# Patient Record
Sex: Male | Born: 1960 | Race: White | Hispanic: No | Marital: Married | Smoking: Never smoker
Health system: Southern US, Community
[De-identification: ages and names within clinical notes are randomized; demographics above are authoritative.]

## PROBLEM LIST (undated history)

## (undated) DIAGNOSIS — R011 Cardiac murmur, unspecified: Secondary | ICD-10-CM

## (undated) DIAGNOSIS — D509 Iron deficiency anemia, unspecified: Secondary | ICD-10-CM

## (undated) DIAGNOSIS — E039 Hypothyroidism, unspecified: Secondary | ICD-10-CM

## (undated) DIAGNOSIS — I10 Essential (primary) hypertension: Secondary | ICD-10-CM

## (undated) DIAGNOSIS — I639 Cerebral infarction, unspecified: Secondary | ICD-10-CM

## (undated) DIAGNOSIS — J302 Other seasonal allergic rhinitis: Secondary | ICD-10-CM

## (undated) DIAGNOSIS — K219 Gastro-esophageal reflux disease without esophagitis: Secondary | ICD-10-CM

## (undated) DIAGNOSIS — Z87442 Personal history of urinary calculi: Secondary | ICD-10-CM

## (undated) HISTORY — PX: ESOPHAGOGASTRODUODENOSCOPY ENDOSCOPY: SHX5814

## (undated) HISTORY — PX: COLONOSCOPY: SHX174

## (undated) HISTORY — PX: TONSILLECTOMY: SUR1361

## (undated) HISTORY — PX: VASECTOMY: SHX75

---

## 2002-11-08 ENCOUNTER — Ambulatory Visit (HOSPITAL_COMMUNITY): Admission: RE | Admit: 2002-11-08 | Discharge: 2002-11-08 | Payer: Self-pay | Admitting: Family Medicine

## 2008-08-17 ENCOUNTER — Emergency Department (HOSPITAL_BASED_OUTPATIENT_CLINIC_OR_DEPARTMENT_OTHER): Admission: EM | Admit: 2008-08-17 | Discharge: 2008-08-17 | Payer: Self-pay | Admitting: Emergency Medicine

## 2012-08-22 ENCOUNTER — Other Ambulatory Visit: Payer: Self-pay

## 2012-08-22 ENCOUNTER — Encounter (HOSPITAL_COMMUNITY): Payer: Self-pay | Admitting: Emergency Medicine

## 2012-08-22 ENCOUNTER — Emergency Department (HOSPITAL_COMMUNITY)
Admission: EM | Admit: 2012-08-22 | Discharge: 2012-08-22 | Disposition: A | Discharge status: Home / Self Care | Payer: 59 | Attending: Emergency Medicine | Admitting: Emergency Medicine

## 2012-08-22 DIAGNOSIS — Y929 Unspecified place or not applicable: Secondary | ICD-10-CM | POA: Insufficient documentation

## 2012-08-22 DIAGNOSIS — E039 Hypothyroidism, unspecified: Secondary | ICD-10-CM | POA: Insufficient documentation

## 2012-08-22 DIAGNOSIS — S199XXA Unspecified injury of neck, initial encounter: Secondary | ICD-10-CM | POA: Insufficient documentation

## 2012-08-22 DIAGNOSIS — R110 Nausea: Secondary | ICD-10-CM | POA: Insufficient documentation

## 2012-08-22 DIAGNOSIS — IMO0002 Reserved for concepts with insufficient information to code with codable children: Secondary | ICD-10-CM | POA: Insufficient documentation

## 2012-08-22 DIAGNOSIS — R142 Eructation: Secondary | ICD-10-CM | POA: Insufficient documentation

## 2012-08-22 DIAGNOSIS — I10 Essential (primary) hypertension: Secondary | ICD-10-CM | POA: Insufficient documentation

## 2012-08-22 DIAGNOSIS — R5383 Other fatigue: Secondary | ICD-10-CM | POA: Insufficient documentation

## 2012-08-22 DIAGNOSIS — Y939 Activity, unspecified: Secondary | ICD-10-CM | POA: Insufficient documentation

## 2012-08-22 DIAGNOSIS — R5381 Other malaise: Secondary | ICD-10-CM | POA: Insufficient documentation

## 2012-08-22 DIAGNOSIS — R141 Gas pain: Secondary | ICD-10-CM | POA: Insufficient documentation

## 2012-08-22 DIAGNOSIS — Z79899 Other long term (current) drug therapy: Secondary | ICD-10-CM | POA: Insufficient documentation

## 2012-08-22 DIAGNOSIS — S0993XA Unspecified injury of face, initial encounter: Secondary | ICD-10-CM | POA: Insufficient documentation

## 2012-08-22 HISTORY — DX: Hypothyroidism, unspecified: E03.9

## 2012-08-22 HISTORY — DX: Essential (primary) hypertension: I10

## 2012-08-22 LAB — POCT I-STAT TROPONIN I: Troponin i, poc: 0 ng/mL (ref 0.00–0.08)

## 2012-08-22 LAB — POCT I-STAT, CHEM 8
BUN: 13 mg/dL (ref 6–23)
Calcium, Ion: 1.17 mmol/L (ref 1.12–1.23)
Chloride: 101 mEq/L (ref 96–112)
Creatinine, Ser: 1 mg/dL (ref 0.50–1.35)
Glucose, Bld: 123 mg/dL — ABNORMAL HIGH (ref 70–99)
HCT: 40 % (ref 39.0–52.0)
Hemoglobin: 13.6 g/dL (ref 13.0–17.0)
Potassium: 4 mEq/L (ref 3.5–5.1)
Sodium: 138 mEq/L (ref 135–145)
TCO2: 26 mmol/L (ref 0–100)

## 2012-08-22 MED ORDER — ONDANSETRON HCL 8 MG PO TABS: 8.0000 mg | ORAL_TABLET | Freq: Three times a day (TID) | ORAL | Status: DC | PRN

## 2012-08-22 NOTE — ED Provider Notes (Signed)
History     CSN: [id number]  Arrival date & time 08/22/12  4098   First MD Initiated Contact with Patient 08/22/12 2003      Chief Complaint  Patient presents with  . Anxiety    (Consider location/radiation/quality/duration/timing/severity/associated sxs/prior treatment) HPI History provided by pt.  Pt developed nausea, "icky stomach" and generalized weakness after lunch this afternoon.  Around the time he was leaving work, he developed some pain in his chin.  Because his father had an MI in his early 68s, pt thought it would be best to be evaluated.  Felt a gas bubble/gurgling in epigastrium and lower chest this afternoon but denies CP and SOB.  No recent exertional CP/SOB.  No recent illnesses including fever, cough, vomiting, diarrhea, urinary sx.  No recent medication changes.  Has had two mild head injuries in the last 2 days.  Hit L forehead on plastic tray two days ago and R side of forehead on car door last night.  No LOC.  No headache, dizziness, vision changes, dysarthria, dysphagia, paresthesias or ataxia since injuries.   Past Medical History  Diagnosis Date  . Hypertension   . Hypothyroidism     Past Surgical History  Procedure Laterality Date  . Tonsillectomy      History reviewed. No pertinent family history.  History  Substance Use Topics  . Smoking status: Never Smoker   . Smokeless tobacco: Never Used  . Alcohol Use: No      Review of Systems  All other systems reviewed and are negative.    Allergies  Doxycycline  Home Medications   Current Outpatient Rx  Name  Route  Sig  Dispense  Refill  . amLODipine (NORVASC) 5 MG tablet   Oral   Take 5 mg by mouth daily.         . cetirizine (ZYRTEC) 10 MG tablet   Oral   Take 10 mg by mouth daily.         Marland Kitchen levothyroxine (SYNTHROID, LEVOTHROID) 75 MCG tablet   Oral   Take 75-112.5 mcg by mouth daily before breakfast. Extra half on wed and sun           BP 148/91  Pulse 98  Temp(Src) 97.4  F (36.3 C) (Oral)  Resp 19  SpO2 100%  Physical Exam  Nursing note and vitals reviewed. Constitutional: He is oriented to person, place, and time. He appears well-developed and well-nourished. No distress.  HENT:  Head: Normocephalic and atraumatic.  No scalp hematoma  Eyes:  Normal appearance  Neck: Normal range of motion.  Cardiovascular: Normal rate, regular rhythm and intact distal pulses.   Pulmonary/Chest: Effort normal and breath sounds normal.  Abdominal: Soft. Bowel sounds are normal. He exhibits no distension. There is no tenderness.  Musculoskeletal: Normal range of motion.  No peripheral edema.   Neurological: He is alert and oriented to person, place, and time. No sensory deficit. Coordination normal.  CN 3-12 intact.  No nystagmus. 5/5 and equal upper and lower extremity strength.  No past pointing.     Skin: Skin is warm and dry. No rash noted.  Psychiatric: He has a normal mood and affect. His behavior is normal.    ED Course  Procedures (including critical care time)   Date: 08/22/2012  Rate: 83  Rhythm: normal sinus rhythm  QRS Axis: normal  Intervals: normal  ST/T Wave abnormalities: normal  Conduction Disutrbances:incomplete RBBB  Narrative Interpretation:   Old EKG Reviewed: none available  Labs Reviewed  POCT I-STAT, CHEM 8 - Abnormal; Notable for the following:    Glucose, Bld 123 (*)    All other components within normal limits  POCT I-STAT TROPONIN I   No results found.   1. Nausea       MDM  52yo M w/ HTN and hypothyroidism presents w/ nausea, "icky stomach" and generalized weakness today.  Felt a gas bubble in chest at rest this afternoon and had pain in his chin at the time he was leaving work.  He is concerned d/t FH of MI.  No recent exertional CP/SOB.  Currently asx.  2 minor frontal head traumas over past two days.  No LOC, headache or other neurologic complaints.  On exam, afebrile, mildly hypertensive, NAD, abd benign, no focal  neuro deficits.  Low suspicion for ACS;  Symptoms atypical, TIMI 0, EKG non-ischemic and troponin neg.  Chem 8 unremarkable.  Pt has been reassured and I recommended f/u with his PCP.  Sx may be secondary to mild concussion or early GI virus.  Return precautions discussed.    Otilio Miu, PA-C 08/22/12 2151

## 2012-08-22 NOTE — ED Notes (Signed)
Patient from work via [employer] c/o palpitations and nausea that started at about 1300 today.  Patient started feeling "jittery" and decided to call EMS.  Patient has hx of htn and anxiety. Patient has a family cardiac hx and wanted to be checked out. Patient on cardiac monitor HR 87 NSR.   Patient alert and oriented x4 and showing no signs of distress at this time.  Will continue to monitor.

## 2012-08-23 NOTE — ED Provider Notes (Signed)
  Medical screening examination/treatment/procedure(s) were performed by non-physician practitioner and as supervising physician I was immediately available for consultation/collaboration.   Gerhard Munch, MD 08/23/12 [phone]

## 2014-09-11 ENCOUNTER — Ambulatory Visit (INDEPENDENT_AMBULATORY_CARE_PROVIDER_SITE_OTHER): Payer: 59 | Admitting: Family Medicine

## 2014-09-11 VITALS — BP 120/86 | HR 92 | Temp 98.1°F | Resp 20 | Ht 69.0 in | Wt 175.2 lb

## 2014-09-11 DIAGNOSIS — J209 Acute bronchitis, unspecified: Secondary | ICD-10-CM | POA: Diagnosis not present

## 2014-09-11 MED ORDER — MONTELUKAST SODIUM 10 MG PO TABS: 10.0000 mg | ORAL_TABLET | Freq: Every day | ORAL | Status: DC

## 2014-09-11 MED ORDER — AZITHROMYCIN 250 MG PO TABS: ORAL_TABLET | ORAL | Status: DC

## 2014-09-11 NOTE — Progress Notes (Signed)
Patient ID: Richard Conrad MRN: [id number], DOB: 27-May-1960, 54 y.o. Date of Encounter: 09/11/2014, 10:48 AM  Primary Physician: No primary care provider on file.  Chief Complaint:  Chief Complaint  Patient presents with  . Cough    cough that comes and goes x 3 months.  lung pressure is worse with laying down    HPI: 54 y.o. year old male presents with a 90 day history of nasal congestion, post nasal drip, sore throat, and cough. Mild sinus pressure. Afebrile. No chills. Nasal congestion thick and green/yellow. Cough is productive of green/yellow sputum and not associated with time of day. Ears feel full, leading to sensation of muffled hearing. Has tried OTC cold preps without success. No GI complaints.   No sick contacts, recent antibiotics, or recent travels.   Patient works as a Cytogeneticist.  No leg trauma, sedentary periods, h/o cancer, or tobacco use.  Past Medical History  Diagnosis Date  . Hypertension   . Hypothyroidism   . Allergy      Home Meds: Prior to Admission medications   Medication Sig Start Date End Date Taking? Authorizing Provider  amLODipine (NORVASC) 5 MG tablet Take 5 mg by mouth daily.   Yes Historical Provider, MD  cetirizine (ZYRTEC) 10 MG tablet Take 10 mg by mouth daily.   Yes Historical Provider, MD  levothyroxine (SYNTHROID, LEVOTHROID) 75 MCG tablet Take 75-112.5 mcg by mouth daily before breakfast. Extra half on wed and sun   Yes Historical Provider, MD  azithromycin (ZITHROMAX Z-PAK) 250 MG tablet 2 today, then one daily 09/11/14   Robyn Haber, MD  montelukast (SINGULAIR) 10 MG tablet Take 1 tablet (10 mg total) by mouth at bedtime. 09/11/14   Robyn Haber, MD  ondansetron (ZOFRAN) 8 MG tablet Take 1 tablet (8 mg total) by mouth every 8 (eight) hours as needed for nausea. Patient not taking: Reported on 09/11/2014 08/22/12   Gertha Calkin, PA-C    Allergies:  Allergies  Allergen Reactions  . Ciprofloxacin     Increase  in anxiety  . Doxycycline Itching    History   Social History  . Marital Status: Married    Spouse Name: N/A  . Number of Children: N/A  . Years of Education: N/A   Occupational History  . Not on file.   Social History Main Topics  . Smoking status: Never Smoker   . Smokeless tobacco: Never Used  . Alcohol Use: No  . Drug Use: No  . Sexual Activity: Not on file   Other Topics Concern  . Not on file   Social History Narrative     Review of Systems: Constitutional: negative for chills, fever, night sweats or weight changes Cardiovascular: negative for chest pain or palpitations Respiratory: negative for hemoptysis, wheezing, or shortness of breath Abdominal: negative for abdominal pain, nausea, vomiting or diarrhea Dermatological: negative for rash Neurologic: negative for headache   Physical Exam: Blood pressure 120/86, pulse 92, temperature 98.1 F (36.7 C), temperature source Oral, resp. rate 20, height 5\' 9"  (1.753 m), weight 175 lb 4 oz (79.493 kg), SpO2 97 %., Body mass index is 25.87 kg/(m^2). General: Well developed, well nourished, in no acute distress. Head: Normocephalic, atraumatic, eyes without discharge, sclera non-icteric, nares are congested. Bilateral auditory canals clear, TM's are without perforation, pearly grey with reflective cone of light bilaterally. No sinus TTP. Oral cavity moist, dentition normal. Posterior pharynx with post nasal drip and mild erythema. No peritonsillar abscess or tonsillar exudate. Neck:  Supple. No thyromegaly. Full ROM. No lymphadenopathy. Lungs: Coarse breath sounds bilaterally without wheezes, rales, or rhonchi. Breathing is unlabored.  Heart: RRR with S1 S2. No murmurs, rubs, or gallops appreciated. Msk:  Strength and tone normal for age. Extremities: No clubbing or cyanosis. No edema. Neuro: Alert and oriented X 3. Moves all extremities spontaneously. CNII-XII grossly in tact. Psych:  Responds to questions appropriately  with a normal affect.     ASSESSMENT AND PLAN:  54 y.o. year old male with bronchitis. This chart was scribed in my presence and reviewed by me personally.    ICD-9-CM ICD-10-CM   1. Acute bronchitis, unspecified organism 466.0 J20.9 azithromycin (ZITHROMAX Z-PAK) 250 MG tablet     montelukast (SINGULAIR) 10 MG tablet   please call if symptoms have not improved in the next 72 hours. In that case we would use a steroid to go with the Singulair. -   ICD-9-CM ICD-10-CM   1. Acute bronchitis, unspecified organism 466.0 J20.9 azithromycin (ZITHROMAX Z-PAK) 250 MG tablet     montelukast (SINGULAIR) 10 MG tablet   -Tylenol/Motrin prn -Rest/fluids -RTC precautions -RTC 3-5 days if no improvement  Signed, Robyn Haber, MD 09/11/2014 10:48 AM

## 2014-09-11 NOTE — Patient Instructions (Signed)

## 2014-09-27 ENCOUNTER — Ambulatory Visit
Admission: RE | Admit: 2014-09-27 | Discharge: 2014-09-27 | Disposition: A | Discharge status: Home / Self Care | Payer: 59 | Source: Ambulatory Visit | Attending: Family Medicine | Admitting: Family Medicine

## 2014-09-27 ENCOUNTER — Other Ambulatory Visit: Payer: Self-pay | Admitting: Family Medicine

## 2014-09-27 DIAGNOSIS — J209 Acute bronchitis, unspecified: Secondary | ICD-10-CM

## 2016-01-13 ENCOUNTER — Other Ambulatory Visit: Payer: Self-pay | Admitting: Surgery

## 2016-03-15 NOTE — Pre-Procedure Instructions (Signed)
    DEKODA FRANSSEN  03/15/2016      [facility] to [address] [place] [address] Phone: [phone] Fax: [place], [place], [place] - [address] [place] [address] [place] [address] Phone: [phone] Fax: [phone]    Your procedure is scheduled on Tuesday, November 21.  Report to [facility] Admitting at 7:00 AM               Your surgery or procedure is scheduled for 9:00 AM   Call this number if you have problems the morning of surgery:[phone]                For any other questions, please call [phone], Monday - Friday 8 AM - 4 PM.   Remember:  Do not eat food or drink liquids after midnight Monday, November 20              Take these medicines the morning of surgery with A SIP OF WATER: amLODipine (NORVASC), levothyroxine (SYNTHROID, LEVOTHROID).                  May take cetirizine (ZYRTEC) if needed.             1 Week prior to surgery STOP taking Aspirin, Aspirin Products (Goody Powder, Excedrin Migraine), Ibuprofen (Advil), Naproxen (Aleve), Vitamins and Herbal Products (ie Fish Oil)   Do not wear jewelry, make-up or nail polish.  Do not wear lotions, powders, or perfumes, or deodorant.   Men may shave face and neck.  Do not bring valuables to the hospital.  [facility] is not responsible for any belongings or valuables.  Contacts, dentures or bridgework may not be worn into surgery.  Leave your suitcase in the car.  After surgery it may be brought to your room.  For patients admitted to the hospital, discharge time will be determined by your treatment team.  Patients discharged the day of surgery will not be allowed to drive home.   Name and phone number of your driver:   -  Special instructions: Review  [facility] - Preparing For Surgery.  Please read over the following fact sheets that you  were given: [facility]- Preparing For Surgery and Patient Instructions for Mupirocin Application, Coughing and Deep Breathing, Pain Booklet

## 2016-03-16 ENCOUNTER — Encounter (HOSPITAL_COMMUNITY)
Admission: RE | Admit: 2016-03-16 | Discharge: 2016-03-16 | Disposition: A | Discharge status: Home / Self Care | Payer: 59 | Source: Ambulatory Visit | Attending: Surgery | Admitting: Surgery

## 2016-03-16 ENCOUNTER — Encounter (HOSPITAL_COMMUNITY): Payer: Self-pay

## 2016-03-16 DIAGNOSIS — I10 Essential (primary) hypertension: Secondary | ICD-10-CM | POA: Insufficient documentation

## 2016-03-16 DIAGNOSIS — Z01810 Encounter for preprocedural cardiovascular examination: Secondary | ICD-10-CM | POA: Diagnosis present

## 2016-03-16 DIAGNOSIS — Z01812 Encounter for preprocedural laboratory examination: Secondary | ICD-10-CM | POA: Insufficient documentation

## 2016-03-16 LAB — BASIC METABOLIC PANEL
Anion gap: 6 (ref 5–15)
BUN: 10 mg/dL (ref 6–20)
CO2: 28 mmol/L (ref 22–32)
Calcium: 9.6 mg/dL (ref 8.9–10.3)
Chloride: 101 mmol/L (ref 101–111)
Creatinine, Ser: 1.01 mg/dL (ref 0.61–1.24)
GFR calc Af Amer: >60 mL/min (ref >60)
GFR calc non Af Amer: >60 mL/min (ref >60)
Glucose, Bld: 104 mg/dL — ABNORMAL HIGH (ref 65–99)
Potassium: 3.8 mmol/L (ref 3.5–5.1)
Sodium: 135 mmol/L (ref 135–145)

## 2016-03-16 LAB — CBC
HCT: 38.7 % — ABNORMAL LOW (ref 39.0–52.0)
Hemoglobin: 13.2 g/dL (ref 13.0–17.0)
MCH: 31.1 pg (ref 26.0–34.0)
MCHC: 34.1 g/dL (ref 30.0–36.0)
MCV: 91.1 fL (ref 78.0–100.0)
Platelets: 308 10*3/uL (ref 150–400)
RBC: 4.25 MIL/uL (ref 4.22–5.81)
RDW: 13.7 % (ref 11.5–15.5)
WBC: 7.5 10*3/uL (ref 4.0–10.5)

## 2016-03-22 NOTE — H&P (Signed)
Richard Conrad  Location: [facility] Patient #: [id number] DOB: 1960-09-10 Married / Language: English / Race: White Male   History of Present Illness The patient is a 55 year old male who presents with a colonic polyp. This is a very pleasant gentleman referred by Dr. Arta Silence for evaluation of a hyperplastic polyp of the cecum. This was found to be near the orifice of the appendix and was carpeted so could not be excised. He has had a history of polyps in the past. He is otherwise healthy without any complaints. He has no issues with his bowels.   Other Problems Anxiety Disorder Gastroesophageal Reflux Disease Heart murmur Hemorrhoids Thyroid Disease  Past Surgical History  Colon Polyp Removal - Colonoscopy Colon Polyp Removal - Open Tonsillectomy Vasectomy  Diagnostic Studies History  Colonoscopy within last year  Allergies Cipro *FLUOROQUINOLONES* Doxycycline Hyclate Coated *TETRACYCLINES*  Medication History Richard Conrad, CMA; AmLODIPine Besylate (5MG  Tablet, Oral) Active. Synthroid (75MCG Tablet, Oral) Active. Medications Reconciled  Social History Richard Conrad, [place];  Caffeine use Coffee, Tea. No alcohol use No drug use Tobacco use Never smoker.  Family History Richard Conrad, [place];  Heart Disease Father. Hypertension Father. Melanoma Mother. Prostate Cancer Father. Thyroid problems Mother.    Review of Systems Richard Conrad CMA;  General Not Present- Appetite Loss, Chills, Fatigue, Fever, Night Sweats, Weight Gain and Weight Loss. Skin Not Present- Change in Wart/Mole, Dryness, Hives, Jaundice, New Lesions, Non-Healing Wounds, Rash and Ulcer. HEENT Present- Seasonal Allergies and Wears glasses/contact lenses. Not Present- Earache, Hearing Loss, Hoarseness, Nose Bleed, Oral Ulcers, Ringing in the Ears, Sinus Pain, Sore Throat, Visual Disturbances and Yellow Eyes. Respiratory Not Present- Bloody sputum, Chronic  Cough, Difficulty Breathing, Snoring and Wheezing. Breast Not Present- Breast Mass, Breast Pain, Nipple Discharge and Skin Changes. Cardiovascular Not Present- Chest Pain, Difficulty Breathing Lying Down, Leg Cramps, Palpitations, Rapid Heart Rate, Shortness of Breath and Swelling of Extremities. Gastrointestinal Present- Hemorrhoids. Not Present- Abdominal Pain, Bloating, Bloody Stool, Change in Bowel Habits, Chronic diarrhea, Constipation, Difficulty Swallowing, Excessive gas, Gets full quickly at meals, Indigestion, Nausea, Rectal Pain and Vomiting. Male Genitourinary Present- Frequency. Not Present- Blood in Urine, Change in Urinary Stream, Impotence, Nocturia, Painful Urination, Urgency and Urine Leakage. Musculoskeletal Not Present- Back Pain, Joint Pain, Joint Stiffness, Muscle Pain, Muscle Weakness and Swelling of Extremities. Neurological Not Present- Decreased Memory, Fainting, Headaches, Numbness, Seizures, Tingling, Tremor, Trouble walking and Weakness. Psychiatric Not Present- Anxiety, Bipolar, Change in Sleep Pattern, Depression, Fearful and Frequent crying. Endocrine Present- Cold Intolerance. Not Present- Excessive Hunger, Hair Changes, Heat Intolerance, Hot flashes and New Diabetes. Hematology Not Present- Blood Thinners, Easy Bruising, Excessive bleeding, Gland problems, HIV and Persistent Infections.  Vitals   Weight: 178 lb Height: 68.5in Body Surface Area: 1.96 m Body Mass Index: 26.67 kg/m  Temp.: 98.7F(Temporal)  Pulse: 77 (Regular)  BP: 126/72 (Sitting, Left Arm, Standard)   Physical Exam  General Mental Status-Alert. General Appearance-Consistent with stated age. Hydration-Well hydrated. Voice-Normal.  Head and Neck Head-normocephalic, atraumatic with no lesions or palpable masses. Trachea-midline. Thyroid Gland Characteristics - normal size and consistency.  Eye Eyeball - Bilateral-Extraocular movements  intact. Sclera/Conjunctiva - Bilateral-No scleral icterus.  Chest and Lung Exam Chest and lung exam reveals -quiet, even and easy respiratory effort with no use of accessory muscles and on auscultation, normal breath sounds, no adventitious sounds and normal vocal resonance. Inspection Chest Wall - Normal. Back - normal.  Breast - Did not examine.  Cardiovascular Cardiovascular examination reveals -normal  heart sounds, regular rate and rhythm with no murmurs and normal pedal pulses bilaterally.  Abdomen Inspection Inspection of the abdomen reveals - No Hernias. Skin - Scar - no surgical scars. Palpation/Percussion Palpation and Percussion of the abdomen reveal - Soft, Non Tender, No Rebound tenderness, No Rigidity (guarding) and No hepatosplenomegaly. Auscultation Auscultation of the abdomen reveals - Bowel sounds normal.  Neurologic - Did not examine.  Musculoskeletal Normal Exam - Left-Upper Extremity Strength Normal and Lower Extremity Strength Normal. Normal Exam - Right-Upper Extremity Strength Normal and Lower Extremity Strength Normal.  Lymphatic - Did not examine.    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 Because of the location of this polyp, I can hopefully just perform a laparoscopic cecotomy without the need for a partial colectomy. I discussed the surgical procedure with him in detail. I discussed the risk of surgery which includes but is not limited to bleeding, infection, injury to surrounding structures, need for a formal partial colectomy depending on the location of the polyp, cardiopulmonary issues, DVT, postoperative recovery, etc. He understands and wished to proceed with surgery which will be scheduled

## 2016-03-23 ENCOUNTER — Observation Stay (HOSPITAL_COMMUNITY): Payer: 59

## 2016-03-23 ENCOUNTER — Encounter (HOSPITAL_COMMUNITY): Payer: Self-pay | Admitting: *Deleted

## 2016-03-23 ENCOUNTER — Observation Stay (HOSPITAL_COMMUNITY)
Admission: RE | Admit: 2016-03-23 | Discharge: 2016-03-24 | Disposition: A | Discharge status: Home / Self Care | Payer: 59 | Source: Ambulatory Visit | Attending: Surgery | Admitting: Surgery

## 2016-03-23 ENCOUNTER — Ambulatory Visit (HOSPITAL_COMMUNITY): Payer: 59 | Admitting: Anesthesiology

## 2016-03-23 ENCOUNTER — Encounter (HOSPITAL_COMMUNITY)
Admission: RE | Disposition: A | Discharge status: Home / Self Care | Payer: Self-pay | Source: Ambulatory Visit | Attending: Surgery

## 2016-03-23 DIAGNOSIS — Z881 Allergy status to other antibiotic agents status: Secondary | ICD-10-CM | POA: Diagnosis not present

## 2016-03-23 DIAGNOSIS — Z86010 Personal history of colonic polyps: Secondary | ICD-10-CM | POA: Insufficient documentation

## 2016-03-23 DIAGNOSIS — Z8249 Family history of ischemic heart disease and other diseases of the circulatory system: Secondary | ICD-10-CM | POA: Insufficient documentation

## 2016-03-23 DIAGNOSIS — E079 Disorder of thyroid, unspecified: Secondary | ICD-10-CM | POA: Diagnosis not present

## 2016-03-23 DIAGNOSIS — Z808 Family history of malignant neoplasm of other organs or systems: Secondary | ICD-10-CM | POA: Insufficient documentation

## 2016-03-23 DIAGNOSIS — Z8349 Family history of other endocrine, nutritional and metabolic diseases: Secondary | ICD-10-CM | POA: Insufficient documentation

## 2016-03-23 DIAGNOSIS — Z9889 Other specified postprocedural states: Secondary | ICD-10-CM | POA: Insufficient documentation

## 2016-03-23 DIAGNOSIS — R011 Cardiac murmur, unspecified: Secondary | ICD-10-CM | POA: Diagnosis not present

## 2016-03-23 DIAGNOSIS — I10 Essential (primary) hypertension: Secondary | ICD-10-CM | POA: Insufficient documentation

## 2016-03-23 DIAGNOSIS — K649 Unspecified hemorrhoids: Secondary | ICD-10-CM | POA: Diagnosis not present

## 2016-03-23 DIAGNOSIS — K635 Polyp of colon: Secondary | ICD-10-CM | POA: Diagnosis present

## 2016-03-23 DIAGNOSIS — K219 Gastro-esophageal reflux disease without esophagitis: Secondary | ICD-10-CM | POA: Diagnosis not present

## 2016-03-23 DIAGNOSIS — T819XXA Unspecified complication of procedure, initial encounter: Secondary | ICD-10-CM

## 2016-03-23 DIAGNOSIS — Z8042 Family history of malignant neoplasm of prostate: Secondary | ICD-10-CM | POA: Diagnosis not present

## 2016-03-23 DIAGNOSIS — F419 Anxiety disorder, unspecified: Secondary | ICD-10-CM | POA: Insufficient documentation

## 2016-03-23 DIAGNOSIS — R042 Hemoptysis: Secondary | ICD-10-CM

## 2016-03-23 DIAGNOSIS — D120 Benign neoplasm of cecum: Secondary | ICD-10-CM | POA: Diagnosis not present

## 2016-03-23 HISTORY — DX: Cardiac murmur, unspecified: R01.1

## 2016-03-23 HISTORY — PX: COLON RESECTION: SHX5231

## 2016-03-23 HISTORY — DX: Iron deficiency anemia, unspecified: D50.9

## 2016-03-23 HISTORY — DX: Gastro-esophageal reflux disease without esophagitis: K21.9

## 2016-03-23 HISTORY — DX: Other seasonal allergic rhinitis: J30.2

## 2016-03-23 HISTORY — PX: APPENDECTOMY: SHX54

## 2016-03-23 HISTORY — PX: COLON SURGERY: SHX602

## 2016-03-23 HISTORY — DX: Personal history of urinary calculi: Z87.442

## 2016-03-23 SURGERY — COLON RESECTION LAPAROSCOPIC
Anesthesia: General | Site: Abdomen

## 2016-03-23 MED ORDER — ONDANSETRON HCL 4 MG/2ML IJ SOLN
INTRAMUSCULAR | Status: DC | PRN
  Administered 2016-03-23: 4 mg via INTRAVENOUS

## 2016-03-23 MED ORDER — POTASSIUM CHLORIDE IN NACL 20-0.9 MEQ/L-% IV SOLN
INTRAVENOUS | Status: DC
  Administered 2016-03-23 – 2016-03-24 (×2): via INTRAVENOUS
  Filled 2016-03-23 (×2): qty 1000

## 2016-03-23 MED ORDER — DIPHENHYDRAMINE HCL 50 MG/ML IJ SOLN: 25.0000 mg | Freq: Four times a day (QID) | INTRAMUSCULAR | Status: DC | PRN

## 2016-03-23 MED ORDER — MIDAZOLAM HCL 5 MG/5ML IJ SOLN
INTRAMUSCULAR | Status: DC | PRN
  Administered 2016-03-23: 2 mg via INTRAVENOUS

## 2016-03-23 MED ORDER — MORPHINE SULFATE (PF) 4 MG/ML IV SOLN
INTRAVENOUS | Status: AC
  Administered 2016-03-23: 4 mg
  Filled 2016-03-23: qty 1

## 2016-03-23 MED ORDER — HYDROCOD POLST-CPM POLST ER 10-8 MG/5ML PO SUER
5.0000 mL | Freq: Four times a day (QID) | ORAL | Status: DC | PRN
  Administered 2016-03-23: 5 mL via ORAL
  Filled 2016-03-23 (×2): qty 5

## 2016-03-23 MED ORDER — PROPOFOL 10 MG/ML IV BOLUS
INTRAVENOUS | Status: DC | PRN
  Administered 2016-03-23: 200 mg via INTRAVENOUS

## 2016-03-23 MED ORDER — LEVOTHYROXINE SODIUM 75 MCG PO TABS: 75.0000 ug | ORAL_TABLET | Freq: Every day | ORAL | Status: DC

## 2016-03-23 MED ORDER — ONDANSETRON HCL 4 MG/2ML IJ SOLN
INTRAMUSCULAR | Status: AC
  Filled 2016-03-23: qty 2

## 2016-03-23 MED ORDER — DIPHENHYDRAMINE HCL 25 MG PO CAPS: 25.0000 mg | ORAL_CAPSULE | Freq: Four times a day (QID) | ORAL | Status: DC | PRN

## 2016-03-23 MED ORDER — FENTANYL CITRATE (PF) 100 MCG/2ML IJ SOLN
INTRAMUSCULAR | Status: AC
Start: 2016-03-23 — End: 2016-03-23
  Filled 2016-03-23: qty 2

## 2016-03-23 MED ORDER — MORPHINE SULFATE (PF) 2 MG/ML IV SOLN: 1.0000 mg | INTRAVENOUS | Status: DC | PRN

## 2016-03-23 MED ORDER — ROCURONIUM BROMIDE 100 MG/10ML IV SOLN
INTRAVENOUS | Status: DC | PRN
  Administered 2016-03-23: 40 mg via INTRAVENOUS

## 2016-03-23 MED ORDER — IBUPROFEN 600 MG PO TABS
600.0000 mg | ORAL_TABLET | Freq: Four times a day (QID) | ORAL | Status: DC | PRN
  Administered 2016-03-23 – 2016-03-24 (×2): 600 mg via ORAL
  Filled 2016-03-23 (×2): qty 1

## 2016-03-23 MED ORDER — PROPOFOL 10 MG/ML IV BOLUS
INTRAVENOUS | Status: AC
  Filled 2016-03-23: qty 40

## 2016-03-23 MED ORDER — LEVOTHYROXINE SODIUM 112 MCG PO TABS
112.0000 ug | ORAL_TABLET | ORAL | Status: DC
  Administered 2016-03-24: 112 ug via ORAL
  Filled 2016-03-23: qty 1

## 2016-03-23 MED ORDER — KETOROLAC TROMETHAMINE 30 MG/ML IJ SOLN
INTRAMUSCULAR | Status: AC
  Filled 2016-03-23: qty 1

## 2016-03-23 MED ORDER — LIDOCAINE 2% (20 MG/ML) 5 ML SYRINGE
INTRAMUSCULAR | Status: AC
  Filled 2016-03-23: qty 5

## 2016-03-23 MED ORDER — CHLORHEXIDINE GLUCONATE CLOTH 2 % EX PADS: 6.0000 | MEDICATED_PAD | Freq: Once | CUTANEOUS | Status: DC

## 2016-03-23 MED ORDER — DEXAMETHASONE SODIUM PHOSPHATE 4 MG/ML IJ SOLN
INTRAMUSCULAR | Status: DC | PRN
  Administered 2016-03-23: 10 mg via INTRAVENOUS

## 2016-03-23 MED ORDER — ROCURONIUM BROMIDE 10 MG/ML (PF) SYRINGE
PREFILLED_SYRINGE | INTRAVENOUS | Status: AC
  Filled 2016-03-23: qty 10

## 2016-03-23 MED ORDER — ONDANSETRON HCL 4 MG/2ML IJ SOLN: 4.0000 mg | Freq: Four times a day (QID) | INTRAMUSCULAR | Status: DC | PRN

## 2016-03-23 MED ORDER — BUPIVACAINE HCL (PF) 0.25 % IJ SOLN
INTRAMUSCULAR | Status: AC
  Filled 2016-03-23: qty 30

## 2016-03-23 MED ORDER — LIDOCAINE HCL (CARDIAC) 20 MG/ML IV SOLN
INTRAVENOUS | Status: DC | PRN
  Administered 2016-03-23: 100 mg via INTRAVENOUS

## 2016-03-23 MED ORDER — 0.9 % SODIUM CHLORIDE (POUR BTL) OPTIME
TOPICAL | Status: DC | PRN
  Administered 2016-03-23: 1000 mL

## 2016-03-23 MED ORDER — FENTANYL CITRATE (PF) 100 MCG/2ML IJ SOLN
INTRAMUSCULAR | Status: AC
  Filled 2016-03-23: qty 2

## 2016-03-23 MED ORDER — ENOXAPARIN SODIUM 40 MG/0.4ML ~~LOC~~ SOLN
40.0000 mg | SUBCUTANEOUS | Status: DC
  Administered 2016-03-24: 40 mg via SUBCUTANEOUS
  Filled 2016-03-23: qty 0.4

## 2016-03-23 MED ORDER — FENTANYL CITRATE (PF) 100 MCG/2ML IJ SOLN
INTRAMUSCULAR | Status: DC | PRN
Start: 2016-03-23 — End: 2016-03-23
  Administered 2016-03-23 (×2): 50 ug via INTRAVENOUS
  Administered 2016-03-23: 25 ug via INTRAVENOUS
  Administered 2016-03-23: 50 ug via INTRAVENOUS
  Administered 2016-03-23: 25 ug via INTRAVENOUS

## 2016-03-23 MED ORDER — ONDANSETRON 4 MG PO TBDP: 4.0000 mg | ORAL_TABLET | Freq: Four times a day (QID) | ORAL | Status: DC | PRN

## 2016-03-23 MED ORDER — SODIUM CHLORIDE 0.9 % IR SOLN
Status: DC | PRN
Start: 2016-03-23 — End: 2016-03-23
  Administered 2016-03-23: 1000 mL

## 2016-03-23 MED ORDER — AMLODIPINE BESYLATE 5 MG PO TABS
5.0000 mg | ORAL_TABLET | Freq: Every day | ORAL | Status: DC
  Filled 2016-03-23: qty 1

## 2016-03-23 MED ORDER — KETOROLAC TROMETHAMINE 30 MG/ML IJ SOLN
INTRAMUSCULAR | Status: DC | PRN
  Administered 2016-03-23: 30 mg via INTRAVENOUS

## 2016-03-23 MED ORDER — BUPIVACAINE-EPINEPHRINE 0.25% -1:200000 IJ SOLN
INTRAMUSCULAR | Status: DC | PRN
  Administered 2016-03-23: 20 mL

## 2016-03-23 MED ORDER — LEVOTHYROXINE SODIUM 75 MCG PO TABS: 75.0000 ug | ORAL_TABLET | ORAL | Status: DC

## 2016-03-23 MED ORDER — SUGAMMADEX SODIUM 200 MG/2ML IV SOLN
INTRAVENOUS | Status: DC | PRN
  Administered 2016-03-23: 200 mg via INTRAVENOUS

## 2016-03-23 MED ORDER — CEFOTETAN DISODIUM-DEXTROSE 2-2.08 GM-% IV SOLR
2.0000 g | INTRAVENOUS | Status: AC
  Administered 2016-03-23: 2 g via INTRAVENOUS
  Filled 2016-03-23: qty 50

## 2016-03-23 MED ORDER — OXYCODONE HCL 5 MG PO TABS
5.0000 mg | ORAL_TABLET | ORAL | Status: DC | PRN
  Administered 2016-03-23 – 2016-03-24 (×2): 5 mg via ORAL
  Filled 2016-03-23 (×2): qty 1

## 2016-03-23 MED ORDER — MIDAZOLAM HCL 2 MG/2ML IJ SOLN
INTRAMUSCULAR | Status: AC
  Filled 2016-03-23: qty 2

## 2016-03-23 MED ORDER — LACTATED RINGERS IV SOLN
INTRAVENOUS | Status: DC
  Administered 2016-03-23 (×3): via INTRAVENOUS

## 2016-03-23 MED ORDER — SUCCINYLCHOLINE CHLORIDE 200 MG/10ML IV SOSY
PREFILLED_SYRINGE | INTRAVENOUS | Status: AC
  Filled 2016-03-23: qty 10

## 2016-03-23 SURGICAL SUPPLY — 73 items
APPLIER CLIP ROT 10 11.4 M/L (STAPLE)
BLADE SURG 10 STRL SS (BLADE) ×2 IMPLANT
BLADE SURG ROTATE 9660 (MISCELLANEOUS) ×2 IMPLANT
CANISTER SUCTION 2500CC (MISCELLANEOUS) ×2 IMPLANT
CELLS DAT CNTRL 66122 CELL SVR (MISCELLANEOUS) IMPLANT
CHLORAPREP W/TINT 26ML (MISCELLANEOUS) ×2 IMPLANT
CLIP APPLIE ROT 10 11.4 M/L (STAPLE) IMPLANT
COVER MAYO STAND STRL (DRAPES) ×2 IMPLANT
COVER SURGICAL LIGHT HANDLE (MISCELLANEOUS) ×4 IMPLANT
CUTTER FLEX LINEAR 45M (STAPLE) ×2 IMPLANT
DERMABOND ADVANCED (GAUZE/BANDAGES/DRESSINGS) ×1
DERMABOND ADVANCED .7 DNX12 (GAUZE/BANDAGES/DRESSINGS) ×1 IMPLANT
DRAPE PROXIMA HALF (DRAPES) IMPLANT
DRAPE UTILITY XL STRL (DRAPES) ×2 IMPLANT
DRAPE WARM FLUID 44X44 (DRAPE) ×2 IMPLANT
DRSG OPSITE POSTOP 4X10 (GAUZE/BANDAGES/DRESSINGS) IMPLANT
DRSG OPSITE POSTOP 4X8 (GAUZE/BANDAGES/DRESSINGS) IMPLANT
ELECT BLADE 6.5 EXT (BLADE) IMPLANT
ELECT CAUTERY BLADE 6.4 (BLADE) ×4 IMPLANT
ELECT REM PT RETURN 9FT ADLT (ELECTROSURGICAL) ×2
ELECTRODE REM PT RTRN 9FT ADLT (ELECTROSURGICAL) ×1 IMPLANT
GEL ULTRASOUND 20GR AQUASONIC (MISCELLANEOUS) IMPLANT
GLOVE BIO SURGEON STRL SZ 6.5 (GLOVE) ×2 IMPLANT
GLOVE BIOGEL PI IND STRL 7.0 (GLOVE) ×2 IMPLANT
GLOVE BIOGEL PI IND STRL 7.5 (GLOVE) ×1 IMPLANT
GLOVE BIOGEL PI INDICATOR 7.0 (GLOVE) ×2
GLOVE BIOGEL PI INDICATOR 7.5 (GLOVE) ×1
GLOVE ECLIPSE 7.5 STRL STRAW (GLOVE) ×4 IMPLANT
GLOVE SURG SIGNA 7.5 PF LTX (GLOVE) ×4 IMPLANT
GOWN STRL REUS W/ TWL LRG LVL3 (GOWN DISPOSABLE) ×2 IMPLANT
GOWN STRL REUS W/ TWL XL LVL3 (GOWN DISPOSABLE) ×2 IMPLANT
GOWN STRL REUS W/TWL LRG LVL3 (GOWN DISPOSABLE) ×2
GOWN STRL REUS W/TWL XL LVL3 (GOWN DISPOSABLE) ×2
KIT BASIN OR (CUSTOM PROCEDURE TRAY) ×2 IMPLANT
LEGGING LITHOTOMY PAIR STRL (DRAPES) IMPLANT
LIGASURE IMPACT 36 18CM CVD LR (INSTRUMENTS) IMPLANT
NS IRRIG 1000ML POUR BTL (IV SOLUTION) ×4 IMPLANT
PAD ARMBOARD 7.5X6 YLW CONV (MISCELLANEOUS) ×4 IMPLANT
PENCIL BUTTON HOLSTER BLD 10FT (ELECTRODE) ×4 IMPLANT
POUCH SPECIMEN RETRIEVAL 10MM (ENDOMECHANICALS) ×2 IMPLANT
RELOAD STAPLE TA45 3.5 REG BLU (ENDOMECHANICALS) ×4 IMPLANT
RTRCTR WOUND ALEXIS 18CM MED (MISCELLANEOUS)
SCALPEL HARMONIC ACE (MISCELLANEOUS) ×2 IMPLANT
SCISSORS LAP 5X35 DISP (ENDOMECHANICALS) ×2 IMPLANT
SET IRRIG TUBING LAPAROSCOPIC (IRRIGATION / IRRIGATOR) ×2 IMPLANT
SLEEVE ENDOPATH XCEL 5M (ENDOMECHANICALS) ×2 IMPLANT
SPECIMEN JAR LARGE (MISCELLANEOUS) IMPLANT
SPECIMEN JAR MEDIUM (MISCELLANEOUS) ×2 IMPLANT
STAPLER VISISTAT 35W (STAPLE) ×2 IMPLANT
SURGILUBE 2OZ TUBE FLIPTOP (MISCELLANEOUS) IMPLANT
SUT MNCRL AB 4-0 PS2 18 (SUTURE) ×2 IMPLANT
SUT PDS AB 1 TP1 96 (SUTURE) ×4 IMPLANT
SUT PROLENE 2 0 CT2 30 (SUTURE) IMPLANT
SUT PROLENE 2 0 KS (SUTURE) IMPLANT
SUT SILK 2 0 SH CR/8 (SUTURE) ×2 IMPLANT
SUT SILK 2 0 TIES 10X30 (SUTURE) ×2 IMPLANT
SUT SILK 3 0 SH CR/8 (SUTURE) ×2 IMPLANT
SUT SILK 3 0 TIES 10X30 (SUTURE) ×2 IMPLANT
SYR BULB IRRIGATION 50ML (SYRINGE) ×2 IMPLANT
SYS LAPSCP GELPORT 120MM (MISCELLANEOUS)
SYSTEM LAPSCP GELPORT 120MM (MISCELLANEOUS) IMPLANT
TOWEL OR 17X26 10 PK STRL BLUE (TOWEL DISPOSABLE) ×2 IMPLANT
TRAY FOLEY CATH 16FRSI W/METER (SET/KITS/TRAYS/PACK) IMPLANT
TRAY LAPAROSCOPIC MC (CUSTOM PROCEDURE TRAY) ×2 IMPLANT
TRAY PROCTOSCOPIC FIBER OPTIC (SET/KITS/TRAYS/PACK) IMPLANT
TROCAR XCEL 12X100 BLDLESS (ENDOMECHANICALS) IMPLANT
TROCAR XCEL BLUNT TIP 100MML (ENDOMECHANICALS) ×2 IMPLANT
TROCAR XCEL NON-BLD 11X100MML (ENDOMECHANICALS) IMPLANT
TROCAR XCEL NON-BLD 5MMX100MML (ENDOMECHANICALS) ×2 IMPLANT
TUBE CONNECTING 12X1/4 (SUCTIONS) ×4 IMPLANT
TUBING FILTER THERMOFLATOR (ELECTROSURGICAL) ×2 IMPLANT
TUBING INSUFFLATION (TUBING) IMPLANT
YANKAUER SUCT BULB TIP NO VENT (SUCTIONS) ×4 IMPLANT

## 2016-03-23 NOTE — Transfer of Care (Signed)
Immediate Anesthesia Transfer of Care Note  Patient: Richard Conrad  Procedure(s) Performed: Procedure(s) (LRB): LAPAROSCOPIC CECECTOMY (N/A)  Patient Location: PACU  Anesthesia Type: General  Level of Consciousness: awake, sedated, patient cooperative and responds to stimulation  Airway & Oxygen Therapy: Patient Spontanous Breathing and Patient connected to face mask oxygen changed to [place] O2  Post-op Assessment: Report given to PACU RN, Post -op Vital signs reviewed and stable and Patient moving all extremities  Post vital signs: Reviewed and stable  Complications: No apparent anesthesia complications

## 2016-03-23 NOTE — Progress Notes (Signed)
Pt coughing up moderate amt bright red blood not frothy looking dr massage aware and at bedside

## 2016-03-23 NOTE — Progress Notes (Signed)
Report given to ronda rn as caregiver 

## 2016-03-23 NOTE — Anesthesia Preprocedure Evaluation (Addendum)
Anesthesia Evaluation  Patient identified by MRN, date of birth, ID band Patient awake    Reviewed: Allergy & Precautions, NPO status   Airway Mallampati: I       Dental  (+) Teeth Intact   Pulmonary neg pulmonary ROS,    breath sounds clear to auscultation       Cardiovascular hypertension,  Rhythm:Regular Rate:Normal     Neuro/Psych    GI/Hepatic negative GI ROS, Neg liver ROS,   Endo/Other  negative endocrine ROSHypothyroidism   Renal/GU negative Renal ROS     Musculoskeletal   Abdominal   Peds  Hematology   Anesthesia Other Findings   Reproductive/Obstetrics                            Anesthesia Physical Anesthesia Plan  ASA: I  Anesthesia Plan: General   Post-op Pain Management:    Induction: Intravenous  Airway Management Planned: Oral ETT  Additional Equipment:   Intra-op Plan:   Post-operative Plan: Extubation in OR  Informed Consent: I have reviewed the patients History and Physical, chart, labs and discussed the procedure including the risks, benefits and alternatives for the proposed anesthesia with the patient or authorized representative who has indicated his/her understanding and acceptance.   Dental advisory given  Plan Discussed with:   Anesthesia Plan Comments:         Anesthesia Quick Evaluation

## 2016-03-23 NOTE — Interval H&P Note (Signed)
History and Physical Interval Note: no change in H and P.  03/23/2016 9:50 AM  Richard Conrad  has presented today for surgery, with the diagnosis of colon polyp  The various methods of treatment have been discussed with the patient and family. After consideration of risks, benefits and other options for treatment, the patient has consented to  Procedure(s): LAPAROSCOPIC CECECTOMY (N/A) as a surgical intervention .  The patient's history has been reviewed, patient examined, no change in status, stable for surgery.  I have reviewed the patient's chart and labs.  Questions were answered to the patient's satisfaction.     Sultan Pargas A

## 2016-03-23 NOTE — Anesthesia Postprocedure Evaluation (Signed)
Anesthesia Post Note  Patient: Richard Conrad  Procedure(s) Performed: Procedure(s) (LRB): LAPAROSCOPIC CECECTOMY (N/A)  Patient location during evaluation: PACU Anesthesia Type: General Level of consciousness: awake and alert Pain management: pain level controlled Vital Signs Assessment: post-procedure vital signs reviewed and stable Respiratory status: nonlabored ventilation, patient connected to nasal cannula oxygen, patient connected to face mask oxygen and aerosol facemask (post obstructive pulm edema after intubation) Cardiovascular status: blood pressure returned to baseline and stable Postop Assessment: no signs of nausea or vomiting Anesthetic complications: yes Anesthetic complication details: respiratory event   Last Vitals:  Vitals:   03/23/16 1245 03/23/16 1252  BP:  134/79  Pulse: (!) 101 100  Resp: 19 16  Temp:      Last Pain:  Vitals:   03/23/16 1215  TempSrc:   PainSc: 0-No pain                 Alejandra Barna,JAMES TERRILL

## 2016-03-23 NOTE — Op Note (Signed)
LAPAROSCOPIC CECECTOMY  Procedure Note  Richard Conrad 03/23/2016   Pre-op Diagnosis: COLON POLYP     Post-op Diagnosis: same  Procedure(s): LAPAROSCOPIC CECECTOMY  Surgeon(s): Coralie Keens, MD  Anesthesia: General  Staff:  Circulator: Milas Kocher, RN Relief Circulator: Quincy Carnes, RN Scrub Person: Rosanne Sack, RN; Cyd Silence, RN Circulator Assistant: Rosanne Sack, RN  Estimated Blood Loss: Minimal               Specimens: sent to path  Indications: This patient had recently undergone Conrad screening colonoscopy was found to have Conrad hyperplastic polyp measuring Conrad proximally 15 mm which was carpeting and right side the appendiceal orifice. Biopsies confirmed hyperplastic polyp. It could not be removed by endoscopy therefore limited cecectomy was recommended  Procedure: The patient was brought to the operating room and identified as the correct patient. He is placed upon the operating table and general anesthesia was begun. His abdomen was prepped and draped in the usual sterile fashion. I made Conrad small transverse incision just below the umbilicus. I carried this down to the fascia which was then opened with Conrad scalpel. Conrad hemostat was then used to pass into the perineal cavity under direct vision. Conrad 0 Vicryl pursing suture was then placed around the fascia opening. The [facility] port was placed through the opening insufflation of the abdomen was begun. Conrad post Conrad 5 mm port in the patient's right upper quadrant and another in the left lower quadrant both under direct vision. The patient's  ascending colon near the cecum was adhered to the abdominal sidewall. I took the adhesions down easily with the scissors. I then elevated the appendix. I took down the mesial appendix with the harmonic scalpel. The cecum was easy to identify. The laparoscopic stapling device was then inserted through the umbilical trocar. I was able to easily position at in order to transect the cecum  and avoid the terminal ileum. The device was then fired removing the appendix and distal end of the cecum.  I then placed this in Conrad bag and removed it through the incision at the umbilicus. I opened the specimen on Conrad back table and confirmed that the suspicious polyp was in the specimen. I then copiously irrigated the abdomen normal saline. Hemostasis appeared to be achieved. All ports were then removed under direct vision and the abdomen was deflated. 0 Vicryl of the umbilicus was tied in place causing the fascial defect. All incisions were then anesthetized with Marcaine and closed with 4-0 Monocryl subcutaneous sutures. Skin glue was then applied. The patient tolerated the procedure well. All the counts were correct at the end of procedure. The patient was then extubated in the operating room and taken in Conrad stable condition.          Richard Conrad   Date: 03/23/2016  Time: 11:26 AM

## 2016-03-23 NOTE — Anesthesia Procedure Notes (Signed)
Procedure Name: Intubation Date/Time: 03/23/2016 10:55 AM Performed by: Justice Rocher Pre-anesthesia Checklist: Patient identified, Emergency Drugs available, Suction available and Patient being monitored Patient Re-evaluated:Patient Re-evaluated prior to inductionOxygen Delivery Method: Circle system utilized Preoxygenation: Pre-oxygenation with 100% oxygen Intubation Type: IV induction Ventilation: Mask ventilation without difficulty Laryngoscope Size: Mac and 4 Grade View: Grade II Tube type: Oral Tube size: 8.0 mm Number of attempts: 1 Airway Equipment and Method: Stylet and Oral airway Placement Confirmation: ETT inserted through vocal cords under direct vision,  positive ETCO2 and breath sounds checked- equal and bilateral Secured at: 23 cm Tube secured with: Tape Dental Injury: Teeth and Oropharynx as per pre-operative assessment

## 2016-03-23 NOTE — Progress Notes (Signed)
Patient was coughing up blood in PACU. Describes it as extreme throat irritation. CXR revealed bilateral pulm infiltates , mild. Talking c the CRNA it is apparent he may have "post-obstructive pulmonary " edema.  Generally this will clear in 24 hrs but may need supportive care c O2 and pulm toilet. Advise observe overnight . Plan to discuss c surgeon prior to patient leaving PACU

## 2016-03-24 ENCOUNTER — Encounter (HOSPITAL_COMMUNITY): Payer: Self-pay | Admitting: Surgery

## 2016-03-24 DIAGNOSIS — D120 Benign neoplasm of cecum: Secondary | ICD-10-CM | POA: Diagnosis not present

## 2016-03-24 MED ORDER — OXYCODONE HCL 5 MG PO TABS: 5.0000 mg | ORAL_TABLET | ORAL | 0 refills | Status: DC | PRN

## 2016-03-24 NOTE — Progress Notes (Signed)
D/C papers gone over with pt. Prescription given to pt. No questions/complaints. iV taken out. Pt. Refused w/c. D/c'd successfully with his wife.

## 2016-03-24 NOTE — Discharge Summary (Signed)
  Patient ID: CHADNEY GERARDOT EY:[id number] 55 y.o. [birth year]  Admit date: 03/23/2016  Discharge date and time: 03/24/2016  Admitting Physician: Coralie Keens  Discharge Physician: Adin Hector  Admission Diagnoses: colon polyp  Discharge Diagnoses: Hyperplastic polyp of ascending colon  Operations: Procedure(s): LAPAROSCOPIC CECECTOMY  Admission Condition: good  Discharged Condition: good  Indication for Admission: The patient is a 55 year old male who presents with a colonic polyp. This is a very pleasant gentleman referred by Dr. Arta Silence for evaluation of a hyperplastic polyp of the cecum. This was found to be near the orifice of the appendix and was carpeted so could not be excised. He has had a history of polyps in the past. He is otherwise healthy without any complaints. He has no issues with his bowels.  He is brought to the hospital electively for resection  Hospital Course: On the day of admission the patient was taken to the operating room and underwent a laparoscopic seek ectomy.  This basically amputated the appendix and all of the surrounding cecum.  The surgery was uneventful.  Pathology is pending at this time.  He was observed overnight. On postop day #1 he looked good and wanted to go home.  He was ambulatory.  Voiding normally.  Had tolerated some diet without nausea.  Abdomen was soft.  Nondistended.  Wounds looked good.  He was given instruction in diet and activities.  He was given a prescription for OxyIR for pain.  He was told to continue his usual medicines.  He has a follow-up appointment with Dr. Coralie Keens on December 12.  Consults: None  Significant Diagnostic Studies: Surgical pathology  Treatments: surgery: Laparoscopic seek ectomy  Disposition: Home  Patient Instructions:    Medication List    TAKE these medications   amLODipine 5 MG tablet Commonly known as:  NORVASC Take 5 mg by mouth daily.   cetirizine 10 MG  tablet Commonly known as:  ZYRTEC Take 10 mg by mouth daily as needed.   cholecalciferol 1000 units tablet Commonly known as:  VITAMIN D Take 1,000 Units by mouth daily.   levothyroxine 75 MCG tablet Commonly known as:  SYNTHROID, LEVOTHROID Take 75-112.5 mcg by mouth daily before breakfast. Extra half on wed and sun   oxyCODONE 5 MG immediate release tablet Commonly known as:  Oxy IR/ROXICODONE Take 1-2 tablets (5-10 mg total) by mouth every 4 (four) hours as needed for moderate pain.       Activity: no heavy lifting for 3 weeks Diet: regular diet Wound Care: none needed  Follow-up:  With Dr. Coralie Keens in 3 weeks.  Signed: Edsel Petrin. Dalbert Batman, M.D., FACS General and minimally invasive surgery Breast and Colorectal Surgery  03/24/2016, 7:09 AM

## 2016-03-24 NOTE — Discharge Instructions (Signed)
[facility] ______[facility] °LAPAROSCOPIC SURGERY: POST OP INSTRUCTIONS °Always review your discharge instruction sheet given to you by the facility where your surgery was performed. °IF YOU HAVE DISABILITY OR FAMILY LEAVE FORMS, YOU MUST BRING THEM TO THE OFFICE FOR PROCESSING.   °DO NOT GIVE THEM TO YOUR DOCTOR. ° °1. A prescription for pain medication may be given to you upon discharge.  Take your pain medication as prescribed, if needed.  If narcotic pain medicine is not needed, then you may take acetaminophen (Tylenol) or ibuprofen (Advil) as needed. °2. Take your usually prescribed medications unless otherwise directed. °3. If you need a refill on your pain medication, please contact your pharmacy.  They will contact our office to request authorization. Prescriptions will not be filled after 5pm or on week-ends. °4. You should follow a light diet the first few days after arrival home, such as soup and crackers, etc.  Be sure to include lots of fluids daily. °5. Most patients will experience some swelling and bruising in the area of the incisions.  Ice packs will help.  Swelling and bruising can take several days to resolve.  °6. It is common to experience some constipation if taking pain medication after surgery.  Increasing fluid intake and taking a stool softener (such as Colace) will usually help or prevent this problem from occurring.  A mild laxative (Milk of Magnesia or Miralax) should be taken according to package instructions if there are no bowel movements after 48 hours. °7. Unless discharge instructions indicate otherwise, you may remove your bandages 24-48 hours after surgery, and you may shower at that time.  You may have steri-strips (small skin tapes) in place directly over the incision.  These strips should be left on the skin for 7-10 days.  If your surgeon used skin glue on the incision, you may shower in 24 hours.  The glue will flake off over the next 2-3 weeks.  Any sutures or  staples will be removed at the office during your follow-up visit. °8. ACTIVITIES:  You may resume regular (light) daily activities beginning the next day--such as daily self-care, walking, climbing stairs--gradually increasing activities as tolerated.  You may have sexual intercourse when it is comfortable.  Refrain from any heavy lifting or straining until approved by your doctor. °a. You may drive when you are no longer taking prescription pain medication, you can comfortably wear a seatbelt, and you can safely maneuver your car and apply brakes. °b. RETURN TO WORK:  __________________________________________________________ °9. You should see your doctor in the office for a follow-up appointment approximately 2-3 weeks after your surgery.  Make sure that you call for this appointment within a day or two after you arrive home to insure a convenient appointment time. °10. OTHER INSTRUCTIONS: __________________________________________________________________________________________________________________________ __________________________________________________________________________________________________________________________ °WHEN TO CALL YOUR DOCTOR: °1. Fever over 101.0 °2. Inability to urinate °3. Continued bleeding from incision. °4. Increased pain, redness, or drainage from the incision. °5. Increasing abdominal pain ° °The clinic staff is available to answer your questions during regular business hours.  Please don’t hesitate to call and ask to speak to one of the nurses for clinical concerns.  If you have a medical emergency, go to the nearest emergency room or call 911.  A surgeon from [facility] is always on call at the hospital. °[address], [address], [place], [place]  [address] ? [address] [address], [place], [place]   [address] °[phone] ? [phone] ? FAX [phone] °Web site:   [email or web address] °

## 2016-06-23 DIAGNOSIS — J209 Acute bronchitis, unspecified: Secondary | ICD-10-CM | POA: Diagnosis not present

## 2016-07-07 DIAGNOSIS — Z125 Encounter for screening for malignant neoplasm of prostate: Secondary | ICD-10-CM | POA: Diagnosis not present

## 2016-07-07 DIAGNOSIS — E039 Hypothyroidism, unspecified: Secondary | ICD-10-CM | POA: Diagnosis not present

## 2016-07-07 DIAGNOSIS — Z0000 Encounter for general adult medical examination without abnormal findings: Secondary | ICD-10-CM | POA: Diagnosis not present

## 2016-07-07 DIAGNOSIS — E7800 Pure hypercholesterolemia, unspecified: Secondary | ICD-10-CM | POA: Diagnosis not present

## 2016-07-07 DIAGNOSIS — I10 Essential (primary) hypertension: Secondary | ICD-10-CM | POA: Diagnosis not present

## 2017-01-18 DIAGNOSIS — R7309 Other abnormal glucose: Secondary | ICD-10-CM | POA: Diagnosis not present

## 2017-01-18 DIAGNOSIS — E7800 Pure hypercholesterolemia, unspecified: Secondary | ICD-10-CM | POA: Diagnosis not present

## 2017-05-03 HISTORY — PX: RADIAL ARTERY ANEURYSM REPAIR: SUR1159

## 2017-07-11 DIAGNOSIS — E039 Hypothyroidism, unspecified: Secondary | ICD-10-CM | POA: Diagnosis not present

## 2017-07-11 DIAGNOSIS — E7800 Pure hypercholesterolemia, unspecified: Secondary | ICD-10-CM | POA: Diagnosis not present

## 2017-07-11 DIAGNOSIS — R5383 Other fatigue: Secondary | ICD-10-CM | POA: Diagnosis not present

## 2017-07-11 DIAGNOSIS — Z0000 Encounter for general adult medical examination without abnormal findings: Secondary | ICD-10-CM | POA: Diagnosis not present

## 2017-07-11 DIAGNOSIS — I10 Essential (primary) hypertension: Secondary | ICD-10-CM | POA: Diagnosis not present

## 2017-07-11 DIAGNOSIS — Z125 Encounter for screening for malignant neoplasm of prostate: Secondary | ICD-10-CM | POA: Diagnosis not present

## 2017-07-14 DIAGNOSIS — I729 Aneurysm of unspecified site: Secondary | ICD-10-CM | POA: Diagnosis not present

## 2017-07-25 DIAGNOSIS — R5383 Other fatigue: Secondary | ICD-10-CM | POA: Diagnosis not present

## 2017-08-04 DIAGNOSIS — E291 Testicular hypofunction: Secondary | ICD-10-CM | POA: Diagnosis not present

## 2017-08-12 ENCOUNTER — Other Ambulatory Visit: Payer: Self-pay

## 2017-08-12 DIAGNOSIS — D2111 Benign neoplasm of connective and other soft tissue of right upper limb, including shoulder: Secondary | ICD-10-CM | POA: Diagnosis not present

## 2017-08-12 DIAGNOSIS — R2231 Localized swelling, mass and lump, right upper limb: Secondary | ICD-10-CM | POA: Diagnosis not present

## 2017-09-17 IMAGING — CR DG CHEST 1V PORT
1 series · 1 of 1 positions shown · non-contrast
Comparison: 09/27/2014

CLINICAL DATA: Hemoptysis

EXAM:
PORTABLE CHEST 1 VIEW

[AP]
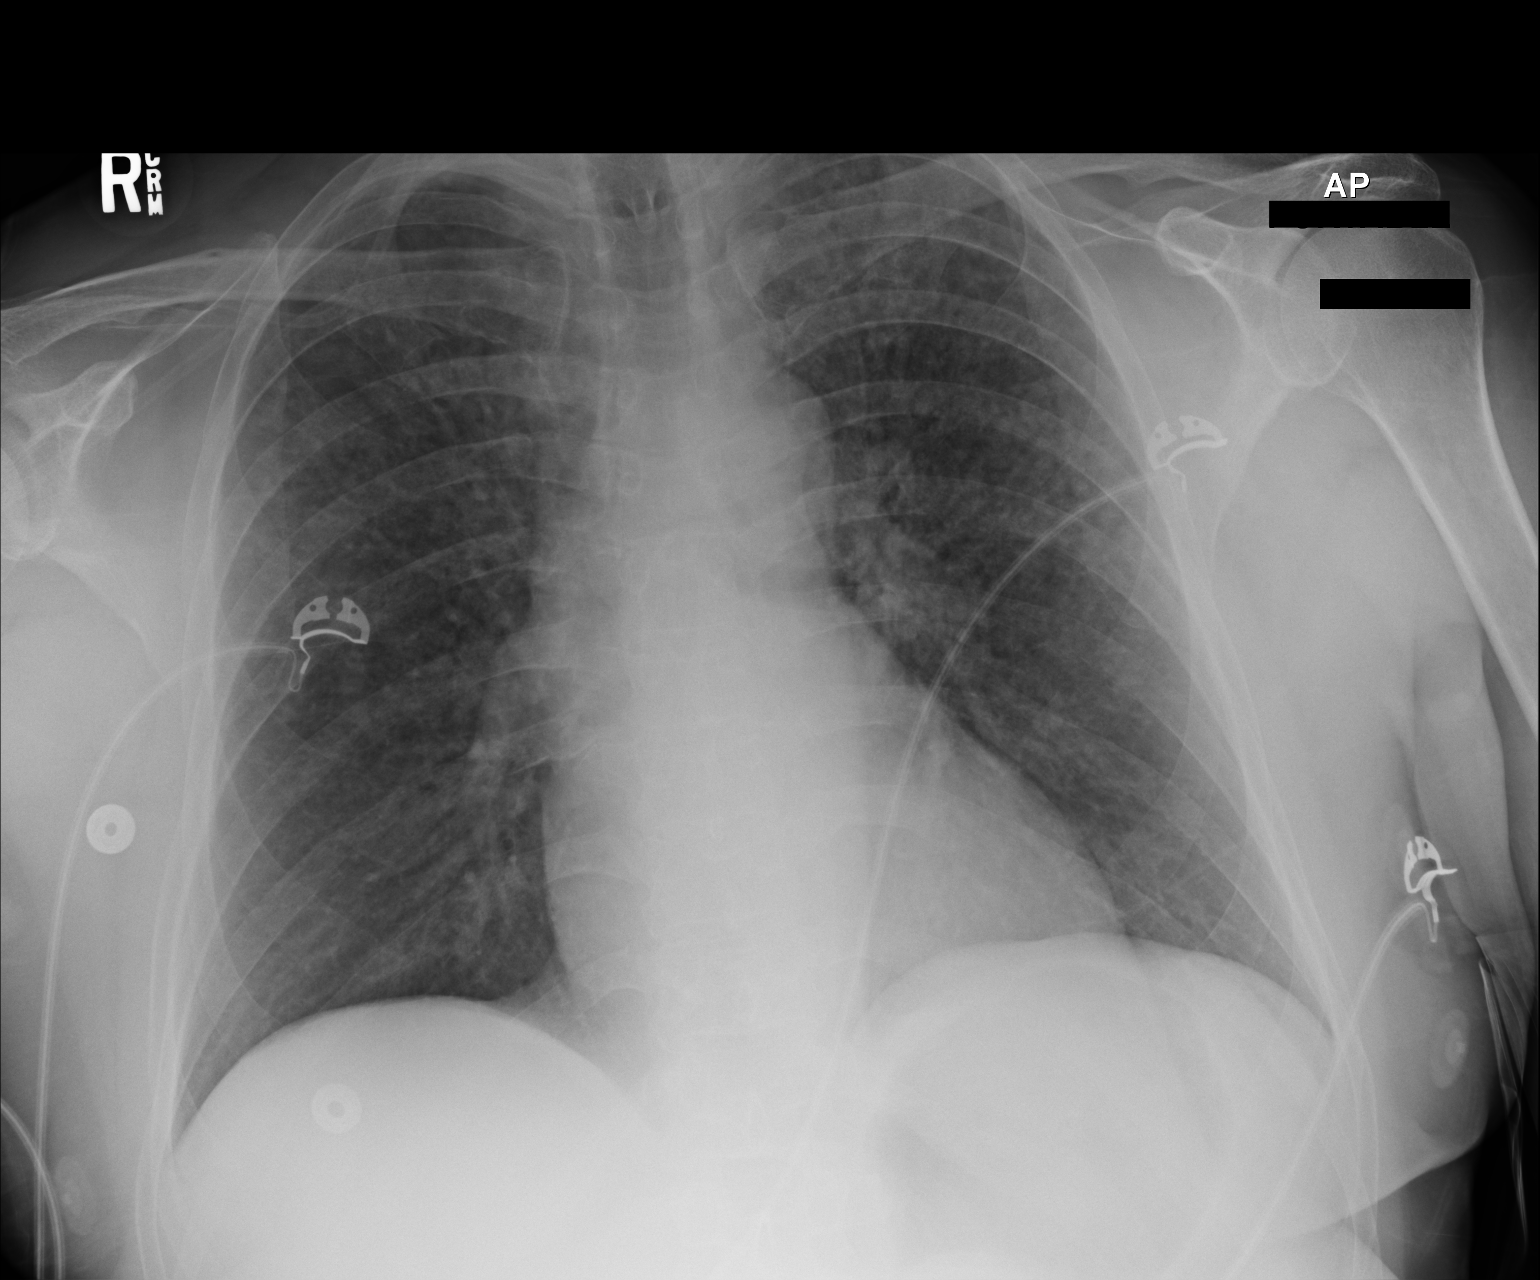

[1 of 1 positions shown; findings below may reference images not displayed]

FINDINGS: Cardiac shadow is within normal limits. The lungs are well aerated
bilaterally. Some alveolar densities are noted throughout the mid
and upper lungs bilaterally which may correspond with the given
clinical history of hemoptysis. No focal mass lesion or focal
infiltrate is noted. No sizable effusion is seen.
IMPRESSION: Bilateral alveolar infiltrative changes which would correspond with
the given clinical history of hemoptysis. Continued follow-up is
recommended.

These results will be called to the ordering clinician or
representative by the Radiologist Assistant, and communication
documented in the PACS or zVision Dashboard.

## 2017-10-18 DIAGNOSIS — E291 Testicular hypofunction: Secondary | ICD-10-CM | POA: Diagnosis not present

## 2017-10-20 DIAGNOSIS — E291 Testicular hypofunction: Secondary | ICD-10-CM | POA: Diagnosis not present

## 2017-10-20 DIAGNOSIS — J301 Allergic rhinitis due to pollen: Secondary | ICD-10-CM | POA: Diagnosis not present

## 2017-10-20 DIAGNOSIS — D229 Melanocytic nevi, unspecified: Secondary | ICD-10-CM | POA: Diagnosis not present

## 2017-12-06 DIAGNOSIS — K219 Gastro-esophageal reflux disease without esophagitis: Secondary | ICD-10-CM | POA: Insufficient documentation

## 2017-12-06 DIAGNOSIS — J302 Other seasonal allergic rhinitis: Secondary | ICD-10-CM | POA: Insufficient documentation

## 2017-12-06 DIAGNOSIS — J342 Deviated nasal septum: Secondary | ICD-10-CM | POA: Diagnosis not present

## 2018-04-10 DIAGNOSIS — E291 Testicular hypofunction: Secondary | ICD-10-CM | POA: Diagnosis not present

## 2018-04-14 DIAGNOSIS — E039 Hypothyroidism, unspecified: Secondary | ICD-10-CM | POA: Diagnosis not present

## 2018-04-14 DIAGNOSIS — I10 Essential (primary) hypertension: Secondary | ICD-10-CM | POA: Diagnosis not present

## 2018-04-14 DIAGNOSIS — E291 Testicular hypofunction: Secondary | ICD-10-CM | POA: Diagnosis not present

## 2018-07-14 DIAGNOSIS — E7800 Pure hypercholesterolemia, unspecified: Secondary | ICD-10-CM | POA: Diagnosis not present

## 2018-07-14 DIAGNOSIS — I10 Essential (primary) hypertension: Secondary | ICD-10-CM | POA: Diagnosis not present

## 2018-07-14 DIAGNOSIS — Z1159 Encounter for screening for other viral diseases: Secondary | ICD-10-CM | POA: Diagnosis not present

## 2018-07-14 DIAGNOSIS — Z0000 Encounter for general adult medical examination without abnormal findings: Secondary | ICD-10-CM | POA: Diagnosis not present

## 2018-07-14 DIAGNOSIS — R0789 Other chest pain: Secondary | ICD-10-CM | POA: Diagnosis not present

## 2018-09-15 ENCOUNTER — Other Ambulatory Visit: Payer: Self-pay | Admitting: Family Medicine

## 2018-09-15 ENCOUNTER — Other Ambulatory Visit: Payer: Self-pay

## 2018-09-15 ENCOUNTER — Ambulatory Visit
Admission: RE | Admit: 2018-09-15 | Discharge: 2018-09-15 | Disposition: A | Discharge status: Home / Self Care | Payer: 59 | Source: Ambulatory Visit | Attending: Family Medicine | Admitting: Family Medicine

## 2018-09-15 DIAGNOSIS — R079 Chest pain, unspecified: Secondary | ICD-10-CM | POA: Diagnosis not present

## 2018-09-15 DIAGNOSIS — R0789 Other chest pain: Secondary | ICD-10-CM

## 2020-04-07 ENCOUNTER — Telehealth: Payer: Self-pay

## 2020-04-07 NOTE — Telephone Encounter (Signed)
Patients daughter called on patients behalf. Patients wife was a patient of [facility] and passed away yesterday and would like to be worked in sooner due to him not being able to sleep due to his wife passing, family is concerned for him he is scheduled for a est visit in may

## 2020-04-07 NOTE — Telephone Encounter (Signed)
Please get him in a same day this week.. thanks, Aw

## 2020-04-09 NOTE — Telephone Encounter (Signed)
Patient scheduled.

## 2020-04-11 ENCOUNTER — Encounter: Payer: Self-pay | Admitting: Family Medicine

## 2020-04-11 ENCOUNTER — Ambulatory Visit: Payer: Self-pay | Admitting: Family Medicine

## 2020-04-11 ENCOUNTER — Other Ambulatory Visit: Payer: Self-pay

## 2020-04-11 VITALS — BP 138/80 | HR 102 | Temp 98.2°F | Ht 69.0 in | Wt 170.6 lb

## 2020-04-11 DIAGNOSIS — Z0000 Encounter for general adult medical examination without abnormal findings: Secondary | ICD-10-CM

## 2020-04-11 DIAGNOSIS — Z1159 Encounter for screening for other viral diseases: Secondary | ICD-10-CM

## 2020-04-11 DIAGNOSIS — E039 Hypothyroidism, unspecified: Secondary | ICD-10-CM

## 2020-04-11 DIAGNOSIS — Z114 Encounter for screening for human immunodeficiency virus [HIV]: Secondary | ICD-10-CM

## 2020-04-11 DIAGNOSIS — Z125 Encounter for screening for malignant neoplasm of prostate: Secondary | ICD-10-CM

## 2020-04-11 DIAGNOSIS — I10 Essential (primary) hypertension: Secondary | ICD-10-CM

## 2020-04-11 DIAGNOSIS — F4321 Adjustment disorder with depressed mood: Secondary | ICD-10-CM

## 2020-04-11 LAB — COMPLETE METABOLIC PANEL WITH GFR
Creat: 1.03 mg/dL (ref 0.70–1.33)
GFR, Est African American: 92 mL/min/{1.73_m2} (ref >=60)
GFR, Est Non African American: 80 mL/min/{1.73_m2} (ref >=60)
Glucose, Bld: 88 mg/dL (ref 65–99)
Total Bilirubin: 0.6 mg/dL (ref 0.2–1.2)

## 2020-04-11 LAB — CBC WITH DIFFERENTIAL/PLATELET
Absolute Monocytes: 700 cells/uL (ref 200–950)
Neutro Abs: 7717 cells/uL (ref 1500–7800)
Total Lymphocyte: 19.5 %

## 2020-04-11 MED ORDER — AMLODIPINE BESYLATE 5 MG PO TABS: 5.0000 mg | ORAL_TABLET | Freq: Every day | ORAL | 3 refills | Status: DC

## 2020-04-11 MED ORDER — LEVOTHYROXINE SODIUM 75 MCG PO TABS: 75.0000 ug | ORAL_TABLET | Freq: Every day | ORAL | 3 refills | Status: DC

## 2020-04-11 NOTE — Patient Instructions (Signed)
-  refilled medicatoin -all of your lab work today  -for sleep, I want you to try exercising and then the following 1) extended release melatonin or short acting 6-10mg /nightly 2) ashwagandha 300mg  30 min-1 hour before bed 3) lavender oil.   If these do not work, please let me know.  You are good for a year.   Get records so I can get colonoscopy!  Praying for you and your family,  Dr. Rogers Blocker

## 2020-04-11 NOTE — Progress Notes (Deleted)
Patient: Richard Conrad MRN: [id number] DOB: August 24, 1960 PCP: Shirline Frees, MD     Subjective:  Chief Complaint  Patient presents with  . Establish Care    HPI: The patient is a 59 y.o. male who presents today to establish care.  Review of Systems  Respiratory: Negative for chest tightness and shortness of breath.   Cardiovascular: Negative for chest pain and palpitations.  Neurological: Negative for dizziness and headaches.    Allergies Patient is allergic to ciprofloxacin and doxycycline.  Past Medical History Patient  has a past medical history of GERD (gastroesophageal reflux disease), Heart murmur, History of kidney stones, Hypertension, Hypothyroidism, Iron deficiency anemia, and Seasonal allergies.  Surgical History Patient  has a past surgical history that includes Tonsillectomy; Vasectomy; Colonoscopy; Esophagogastroduodenoscopy endoscopy; Colon surgery (03/23/2016); Appendectomy (03/23/2016); and Colon resection (N/A, 03/23/2016).  Family History Pateint's family history includes Cancer in his father and mother; Hyperlipidemia in his father and mother.  Social History Patient  reports that he has never smoked. He has never used smokeless tobacco. He reports that he does not drink alcohol and does not use drugs.    Objective: Vitals:   04/11/20 1309  BP: 138/80  Pulse: (!) 102  Temp: 98.2 F (36.8 C)  TempSrc: Temporal  SpO2: 98%  Weight: 170 lb 9.6 oz (77.4 kg)  Height: 5\' 9"  (1.753 m)    Body mass index is 25.19 kg/m.  Physical Exam     Assessment/plan:    This visit occurred during the SARS-CoV-2 public health emergency.  Safety protocols were in place, including screening questions prior to the visit, additional usage of staff PPE, and extensive cleaning of exam room while observing appropriate contact time as indicated for disinfecting solutions.     No follow-ups on file.    Orma Flaming, MD [place]   04/11/2020

## 2020-04-11 NOTE — Progress Notes (Signed)
Patient: Richard Conrad MRN: [id number] DOB: 09-07-1960 PCP: Shirline Frees, MD     Subjective:  Chief Complaint  Patient presents with  . Establish Care    HPI: The patient is a 59 y.o. male who presents today for annual exam and establishing care. He denies any changes to past medical history. There have been no recent hospitalizations. They are following a well balanced diet and exercise plan. Weight has been stable. Just lost wife to covid.   No colon or breast cancer in first degree relative. Prostate cancer in his father.   Hypertension: Here for follow up of hypertension.  Currently on norvasc 5mg /day. Does not takes medication as prescribed and denies any side effects. Exercise includes walking.  Weight has been stable. Denies any chest pain, headaches, shortness of breath, vision changes, swelling in lower extremities.   Hypothyroidism Acquired. No surgery. Takes medication as prescribed.   Grieving as he just lost his wife to covid. Not sleeping good at night, but was having an issue prior to her hospitalization and death. He can sometimes fall asleep, but wakes up. His schedule has been messed up as well due to laura's illness/death. Denies any si/hi or depression symptoms.    Immunization History  Administered Date(s) Administered  . Moderna SARS-COVID-2 Vaccination 03/28/2020   Colonoscopy: 12/2015. F/u in 3-5 years. Unsure which one.  PSA: today  Review of Systems  Constitutional: Negative for chills, fatigue and fever.  HENT: Negative for dental problem, ear pain, hearing loss and trouble swallowing.   Eyes: Negative for visual disturbance.  Respiratory: Negative for cough, chest tightness and shortness of breath.   Cardiovascular: Negative for chest pain, palpitations and leg swelling.  Gastrointestinal: Negative for abdominal pain, blood in stool, diarrhea and nausea.  Endocrine: Negative for cold intolerance, polydipsia, polyphagia and polyuria.  Genitourinary:  Negative for dysuria and hematuria.  Musculoskeletal: Negative for arthralgias.  Skin: Negative for rash.  Neurological: Negative for dizziness and headaches.  Psychiatric/Behavioral: Positive for sleep disturbance. Negative for dysphoric mood. The patient is not nervous/anxious.     Allergies Patient is allergic to ciprofloxacin and doxycycline.  Past Medical History Patient  has a past medical history of GERD (gastroesophageal reflux disease), Heart murmur, History of kidney stones, Hypertension, Hypothyroidism, Iron deficiency anemia, and Seasonal allergies.  Surgical History Patient  has a past surgical history that includes Tonsillectomy; Vasectomy; Colonoscopy; Esophagogastroduodenoscopy endoscopy; Colon surgery (03/23/2016); Appendectomy (03/23/2016); and Colon resection (N/A, 03/23/2016).  Family History Pateint's family history includes Cancer in his father and mother; Hyperlipidemia in his father and mother.  Social History Patient  reports that he has never smoked. He has never used smokeless tobacco. He reports that he does not drink alcohol and does not use drugs.    Objective: Vitals:   04/11/20 1309  BP: 138/80  Pulse: (!) 102  Temp: 98.2 F (36.8 C)  TempSrc: Temporal  SpO2: 98%  Weight: 170 lb 9.6 oz (77.4 kg)  Height: 5\' 9"  (1.753 m)    Body mass index is 25.19 kg/m.  Physical Exam Vitals reviewed.  Constitutional:      Appearance: Normal appearance. He is well-developed, normal weight and well-nourished.  HENT:     Head: Normocephalic and atraumatic.     Right Ear: Tympanic membrane, ear canal and external ear normal.     Left Ear: Tympanic membrane, ear canal and external ear normal.     Nose: Nose normal.     Mouth/Throat:     Mouth: Oropharynx is  clear and moist. Mucous membranes are moist.  Eyes:     Extraocular Movements: Extraocular movements intact and EOM normal.     Conjunctiva/sclera: Conjunctivae normal.     Pupils: Pupils are equal,  round, and reactive to light.  Neck:     Thyroid: No thyromegaly.     Vascular: No carotid bruit.  Cardiovascular:     Rate and Rhythm: Normal rate and regular rhythm.     Pulses: Normal pulses and intact distal pulses.     Heart sounds: Normal heart sounds. No murmur heard.   Pulmonary:     Effort: Pulmonary effort is normal.     Breath sounds: Normal breath sounds.  Abdominal:     General: Abdomen is flat. Bowel sounds are normal. There is no distension.     Palpations: Abdomen is soft.     Tenderness: There is no abdominal tenderness.  Musculoskeletal:     Cervical back: Normal range of motion and neck supple.  Lymphadenopathy:     Cervical: No cervical adenopathy.  Skin:    General: Skin is warm and dry.     Capillary Refill: Capillary refill takes less than 2 seconds.     Findings: No rash.  Neurological:     General: No focal deficit present.     Mental Status: He is alert and oriented to person, place, and time.     Cranial Nerves: No cranial nerve deficit.     Coordination: Coordination normal.     Deep Tendon Reflexes: Reflexes normal.  Psychiatric:        Mood and Affect: Mood and affect and mood normal.        Behavior: Behavior normal.        [place] Office Visit from 04/11/2020 in [place]  PHQ-9 Total Score 5     Assessment/plan: 1. Annual physical exam HM reviewed and updated. Requesting records form previous physician as he has had his cscope and unsure about tetanus. Had first dose of covid vaccine. Is active and will get back into exercise. Not fasting, but no issues with cholesterol in past. Routine labs today. Keep up healthy lifestyle.  Patient counseling [x]    Nutrition: Stressed importance of moderation in sodium/caffeine intake, saturated fat and cholesterol, caloric balance, sufficient intake of fresh fruits, vegetables, fiber, calcium, iron, and 1 mg of folate supplement per day (for females capable of  pregnancy).  [x]    Stressed the importance of regular exercise.   []    Substance Abuse: Discussed cessation/primary prevention of tobacco, alcohol, or other drug use; driving or other dangerous activities under the influence; availability of treatment for abuse.   [x]    Injury prevention: Discussed safety belts, safety helmets, smoke detector, smoking near bedding or upholstery.   [x]    Sexuality: Discussed sexually transmitted diseases, partner selection, use of condoms, avoidance of unintended pregnancy  and contraceptive alternatives.  [x]    Dental health: Discussed importance of regular tooth brushing, flossing, and dental visits.  [x]    Health maintenance and immunizations reviewed. Please refer to Health maintenance section.    - CBC with Differential/Platelet; Future - Lipid panel; Future - Lipid panel - CBC with Differential/Platelet  2. Benign essential HTN Decently controlled. Discussed that I want him taking his medication daily, not as needed. He will do this. Encouraged exercise. Refills given and routine labs done today. F/u in one year.  - CBC with Differential/Platelet; Future - COMPLETE METABOLIC PANEL WITH GFR; Future - Microalbumin / creatinine urine  ratio; Future - Microalbumin / creatinine urine ratio - COMPLETE METABOLIC PANEL WITH GFR - CBC with Differential/Platelet  3. Hypothyroidism (acquired)  - T4, free; Future - TSH; Future - TSH - T4, free  4. Prostate cancer screening  - PSA; Future - PSA  5. Encounter for screening for HIV  - HIV Antibody (routine testing w rflx); Future - HIV Antibody (routine testing w rflx)  6. Encounter for hepatitis C screening test for low risk patient  - Hepatitis C antibody; Future - Hepatitis C antibody  7. Grieving -going through a lot with recent loss of wife. Very very supportive family/pastor/church -not depressed and appropriately sad.  -discussed treatment for insomnia that is more conservative including  sleep hygiene, extended release melatonin and ashwaghanda as well as exercising more. If this doesn't work he is to let me know and will trial trazodone. Will send if he fails conservative measures.  -offered counseling, but he feels like he has a lot of counseling with family/church.    This visit occurred during the SARS-CoV-2 public health emergency.  Safety protocols were in place, including screening questions prior to the visit, additional usage of staff PPE, and extensive cleaning of exam room while observing appropriate contact time as indicated for disinfecting solutions.     Return in about 1 year (around 04/11/2021) for annual/blood pressure/thyroid.     Orma Flaming, MD [place]  04/11/2020

## 2020-04-14 LAB — LIPID PANEL
Cholesterol: 192 mg/dL (ref <200)
HDL: 52 mg/dL (ref >=40)
LDL Cholesterol (Calc): 113 mg/dL (calc) — ABNORMAL HIGH
Non-HDL Cholesterol (Calc): 140 mg/dL (calc) — ABNORMAL HIGH (ref <130)
Total CHOL/HDL Ratio: 3.7 (calc) (ref <5.0)
Triglycerides: 159 mg/dL — ABNORMAL HIGH (ref <150)

## 2020-04-14 LAB — CBC WITH DIFFERENTIAL/PLATELET
Basophils Absolute: 53 cells/uL (ref 0–200)
Basophils Relative: 0.5 %
Eosinophils Absolute: 64 cells/uL (ref 15–500)
Eosinophils Relative: 0.6 %
HCT: 35.9 % — ABNORMAL LOW (ref 38.5–50.0)
Hemoglobin: 12.2 g/dL — ABNORMAL LOW (ref 13.2–17.1)
Lymphs Abs: 2067 cells/uL (ref 850–3900)
MCH: 31.4 pg (ref 27.0–33.0)
MCHC: 34 g/dL (ref 32.0–36.0)
MCV: 92.5 fL (ref 80.0–100.0)
MPV: 8.8 fL (ref 7.5–12.5)
Monocytes Relative: 6.6 %
Neutrophils Relative %: 72.8 %
Platelets: 449 10*3/uL — ABNORMAL HIGH (ref 140–400)
RBC: 3.88 10*6/uL — ABNORMAL LOW (ref 4.20–5.80)
RDW: 13.2 % (ref 11.0–15.0)
WBC: 10.6 10*3/uL (ref 3.8–10.8)

## 2020-04-14 LAB — HEPATITIS C ANTIBODY
Hepatitis C Ab: NONREACTIVE
SIGNAL TO CUT-OFF: 0.05 (ref <1.00)

## 2020-04-14 LAB — COMPLETE METABOLIC PANEL WITH GFR
AG Ratio: 1.6 (calc) (ref 1.0–2.5)
ALT: 21 U/L (ref 9–46)
AST: 22 U/L (ref 10–35)
Albumin: 4.4 g/dL (ref 3.6–5.1)
Alkaline phosphatase (APISO): 50 U/L (ref 35–144)
BUN: 18 mg/dL (ref 7–25)
CO2: 29 mmol/L (ref 20–32)
Calcium: 10 mg/dL (ref 8.6–10.3)
Chloride: 98 mmol/L (ref 98–110)
Globulin: 2.8 g/dL (calc) (ref 1.9–3.7)
Potassium: 3.7 mmol/L (ref 3.5–5.3)
Sodium: 135 mmol/L (ref 135–146)
Total Protein: 7.2 g/dL (ref 6.1–8.1)

## 2020-04-14 LAB — MICROALBUMIN / CREATININE URINE RATIO
Creatinine, Urine: 360 mg/dL — ABNORMAL HIGH (ref 20–320)
Microalb Creat Ratio: 14 mcg/mg creat (ref <30)
Microalb, Ur: 5.1 mg/dL

## 2020-04-14 LAB — T4, FREE: Free T4: 1.2 ng/dL (ref 0.8–1.8)

## 2020-04-14 LAB — HIV ANTIBODY (ROUTINE TESTING W REFLEX): HIV 1&2 Ab, 4th Generation: NONREACTIVE

## 2020-04-14 LAB — PSA: PSA: 1.28 ng/mL (ref <=4.0)

## 2020-04-14 LAB — TSH: TSH: 2.91 mIU/L (ref 0.40–4.50)

## 2020-04-16 ENCOUNTER — Encounter: Payer: Self-pay | Admitting: Family Medicine

## 2020-04-21 ENCOUNTER — Other Ambulatory Visit: Payer: Self-pay | Admitting: Family Medicine

## 2020-04-21 DIAGNOSIS — D508 Other iron deficiency anemias: Secondary | ICD-10-CM

## 2020-05-08 ENCOUNTER — Other Ambulatory Visit (INDEPENDENT_AMBULATORY_CARE_PROVIDER_SITE_OTHER): Payer: Self-pay

## 2020-05-08 ENCOUNTER — Other Ambulatory Visit: Payer: Self-pay

## 2020-05-08 DIAGNOSIS — Z20822 Contact with and (suspected) exposure to covid-19: Secondary | ICD-10-CM

## 2020-05-08 DIAGNOSIS — D508 Other iron deficiency anemias: Secondary | ICD-10-CM

## 2020-05-08 LAB — CBC WITH DIFFERENTIAL/PLATELET
Absolute Monocytes: 566 cells/uL (ref 200–950)
Lymphs Abs: 2526 cells/uL (ref 850–3900)
MPV: 9 fL (ref 7.5–12.5)
Monocytes Relative: 6.9 %
Neutro Abs: 4928 cells/uL (ref 1500–7800)
Neutrophils Relative %: 60.1 %

## 2020-05-09 LAB — CBC WITH DIFFERENTIAL/PLATELET
Basophils Absolute: 41 cells/uL (ref 0–200)
Basophils Relative: 0.5 %
Eosinophils Absolute: 139 cells/uL (ref 15–500)
Eosinophils Relative: 1.7 %
HCT: 36.7 % — ABNORMAL LOW (ref 38.5–50.0)
Hemoglobin: 12.5 g/dL — ABNORMAL LOW (ref 13.2–17.1)
MCH: 31.6 pg (ref 27.0–33.0)
MCHC: 34.1 g/dL (ref 32.0–36.0)
MCV: 92.9 fL (ref 80.0–100.0)
Platelets: 416 10*3/uL — ABNORMAL HIGH (ref 140–400)
RBC: 3.95 10*6/uL — ABNORMAL LOW (ref 4.20–5.80)
RDW: 13.3 % (ref 11.0–15.0)
Total Lymphocyte: 30.8 %
WBC: 8.2 10*3/uL (ref 3.8–10.8)

## 2020-05-09 LAB — IRON,TIBC AND FERRITIN PANEL
%SAT: 32 % (calc) (ref 20–48)
Ferritin: 63 ng/mL (ref 38–380)
Iron: 94 ug/dL (ref 50–180)
TIBC: 296 mcg/dL (calc) (ref 250–425)

## 2020-05-11 LAB — NOVEL CORONAVIRUS, NAA: SARS-CoV-2, NAA: NOT DETECTED

## 2020-09-18 ENCOUNTER — Ambulatory Visit: Payer: 59 | Admitting: Family Medicine

## 2020-11-13 ENCOUNTER — Telehealth: Payer: Self-pay

## 2020-11-13 NOTE — Telephone Encounter (Signed)
Called pt and informed that Dr Rogers Blocker is no longer here at this office. Asked to call back to schedule

## 2021-04-02 ENCOUNTER — Other Ambulatory Visit: Payer: Self-pay | Admitting: Family Medicine

## 2021-04-07 ENCOUNTER — Ambulatory Visit (INDEPENDENT_AMBULATORY_CARE_PROVIDER_SITE_OTHER): Payer: Self-pay | Admitting: Family Medicine

## 2021-04-07 ENCOUNTER — Other Ambulatory Visit: Payer: Self-pay | Admitting: *Deleted

## 2021-04-07 ENCOUNTER — Encounter: Payer: Self-pay | Admitting: Family Medicine

## 2021-04-07 ENCOUNTER — Other Ambulatory Visit: Payer: Self-pay

## 2021-04-07 VITALS — BP 127/79 | HR 85 | Temp 98.0°F | Ht 69.0 in | Wt 168.0 lb

## 2021-04-07 DIAGNOSIS — R739 Hyperglycemia, unspecified: Secondary | ICD-10-CM

## 2021-04-07 DIAGNOSIS — E039 Hypothyroidism, unspecified: Secondary | ICD-10-CM

## 2021-04-07 DIAGNOSIS — L729 Follicular cyst of the skin and subcutaneous tissue, unspecified: Secondary | ICD-10-CM

## 2021-04-07 DIAGNOSIS — Z125 Encounter for screening for malignant neoplasm of prostate: Secondary | ICD-10-CM

## 2021-04-07 DIAGNOSIS — K219 Gastro-esophageal reflux disease without esophagitis: Secondary | ICD-10-CM

## 2021-04-07 DIAGNOSIS — I10 Essential (primary) hypertension: Secondary | ICD-10-CM

## 2021-04-07 LAB — COMPREHENSIVE METABOLIC PANEL
ALT: 16 U/L (ref 0–53)
AST: 19 U/L (ref 0–37)
Albumin: 4.7 g/dL (ref 3.5–5.2)
Alkaline Phosphatase: 56 U/L (ref 39–117)
BUN: 19 mg/dL (ref 6–23)
CO2: 31 mEq/L (ref 19–32)
Calcium: 10 mg/dL (ref 8.4–10.5)
Chloride: 98 mEq/L (ref 96–112)
Creatinine, Ser: 1.03 mg/dL (ref 0.40–1.50)
GFR: 79.41 mL/min (ref >60.00)
Glucose, Bld: 89 mg/dL (ref 70–99)
Potassium: 4.2 mEq/L (ref 3.5–5.1)
Sodium: 136 mEq/L (ref 135–145)
Total Bilirubin: 0.5 mg/dL (ref 0.2–1.2)
Total Protein: 7.5 g/dL (ref 6.0–8.3)

## 2021-04-07 LAB — LIPID PANEL
Cholesterol: 233 mg/dL — ABNORMAL HIGH (ref 0–200)
HDL: 63.7 mg/dL (ref >39.00)
LDL Cholesterol: 152 mg/dL — ABNORMAL HIGH (ref 0–99)
NonHDL: 169.14
Total CHOL/HDL Ratio: 4
Triglycerides: 86 mg/dL (ref 0.0–149.0)
VLDL: 17.2 mg/dL (ref 0.0–40.0)

## 2021-04-07 LAB — CBC
HCT: 40.1 % (ref 39.0–52.0)
Hemoglobin: 13.4 g/dL (ref 13.0–17.0)
MCHC: 33.3 g/dL (ref 30.0–36.0)
MCV: 95.5 fl (ref 78.0–100.0)
Platelets: 359 10*3/uL (ref 150.0–400.0)
RBC: 4.2 Mil/uL — ABNORMAL LOW (ref 4.22–5.81)
RDW: 13.6 % (ref 11.5–15.5)
WBC: 8.9 10*3/uL (ref 4.0–10.5)

## 2021-04-07 LAB — PSA: PSA: 2.21 ng/mL (ref 0.10–4.00)

## 2021-04-07 LAB — HEMOGLOBIN A1C: Hgb A1c MFr Bld: 5.7 % (ref 4.6–6.5)

## 2021-04-07 LAB — TSH: TSH: 3.3 u[IU]/mL (ref 0.35–5.50)

## 2021-04-07 MED ORDER — LEVOTHYROXINE SODIUM 75 MCG PO TABS: ORAL_TABLET | ORAL | 3 refills | Status: DC

## 2021-04-07 MED ORDER — AMLODIPINE BESYLATE 5 MG PO TABS: 5.0000 mg | ORAL_TABLET | Freq: Every day | ORAL | 3 refills | Status: DC

## 2021-04-07 NOTE — Assessment & Plan Note (Signed)
Check TSH.  Continue Synthroid at current dose.

## 2021-04-07 NOTE — Assessment & Plan Note (Signed)
Stable off medications.  

## 2021-04-07 NOTE — Assessment & Plan Note (Signed)
At goal.  Continue amlodipine 5 mg daily. 

## 2021-04-07 NOTE — Patient Instructions (Signed)
It was very nice to see you today!  We will check blood work.  I will refill your medications.  We will refer you to see the dermatologist.  We will see back in 1 year for your next annual checkup with labs.  Please come back sooner if needed.  Take care, Dr Jerline Pain  PLEASE NOTE:  If you had any lab tests please let us know if you have not heard back within a few days. You may see your results on mychart before we have a chance to review them but we will give you a call once they are reviewed by [place]. If we ordered any referrals today, please let us know if you have not heard from their office within the next week.   Please try these tips to maintain a healthy lifestyle:  Eat at least 3 REAL meals and 1-2 snacks per day.  Aim for no more than 5 hours between eating.  If you eat breakfast, please do so within one hour of getting up.   Each meal should contain half fruits/vegetables, one quarter protein, and one quarter carbs (no bigger than a computer mouse)  Cut down on sweet beverages. This includes juice, soda, and sweet tea.   Drink at least 1 glass of water with each meal and aim for at least 8 glasses per day  Exercise at least 150 minutes every week.

## 2021-04-07 NOTE — Progress Notes (Addendum)
Chief Complaint:  [name] is a 60 y.o. male who presents today for his annual comprehensive physical exam.    Assessment/Plan:  New/Acute Problems: Grief Patient lost his wife a year ago.  There has been a difficult last couple of months due to anniversary.  He has good support structure with family and church.  Declines referral to therapy at this time.  No reported SI or HI.  Skin Cyst Will place referral to dermatology.  Chronic Problems Addressed Today: Hypothyroidism (acquired) Check TSH.  Continue Synthroid at current dose.  Benign essential HTN At goal.  Continue amlodipine 5 mg daily.  Gastroesophageal reflux disease without esophagitis Stable off medications.  Preventative Healthcare: Declined flu vaccine. Will get blood work done today. Check PSA.  UTD on Covid vaccines.  Patient Counseling(The following topics were reviewed and/or handout was given):  -Nutrition: Stressed importance of moderation in sodium/caffeine intake, saturated fat and cholesterol, caloric balance, sufficient intake of fresh fruits, vegetables, and fiber.  -Stressed the importance of regular exercise.   -Substance Abuse: Discussed cessation/primary prevention of tobacco, alcohol, or other drug use; driving or other dangerous activities under the influence; availability of treatment for abuse.   -Injury prevention: Discussed safety belts, safety helmets, smoke detector, smoking near bedding or upholstery.   -Sexuality: Discussed sexually transmitted diseases, partner selection, use of condoms, avoidance of unintended pregnancy and contraceptive alternatives.   -Dental health: Discussed importance of regular tooth brushing, flossing, and dental visits.  -Health maintenance and immunizations reviewed. Please refer to Health maintenance section.  Return to care in 1 year for next preventative visit.     Subjective:  HPI:  He has no acute complaints today.   He is here to transfer of care.  His previous PCP no longer works at this office He is retired. He is grieving as he lost his wife about a year ago. He is receiving support from his family and church. Denies SI or HI.  He is concerned about his increase heart rate. He notes heart rate increase whenever he exercise or lift weight. Usually goes up to 145 bpm. He would like to discuss about it today.  He also have a cyst in his right ear for the last couple of months. He notes it started as pimple.  He describe it "hard and crusty". Pain when he touches the area. No drainage. No fevers or chills  He is requesting refill on Synthroid 75 mg for hypothyroidism.   Lifestyle Diet: Balanced: plenty of fruits and vegetable. Exercise: Goes to gym. Lift weight.   Depression screen PHQ 2/9 04/07/2021  Decreased Interest 0  Down, Depressed, Hopeless 0  PHQ - 2 Score 0  Altered sleeping -  Tired, decreased energy -  Change in appetite -  Feeling bad or failure about yourself  -  Trouble concentrating -  Suicidal thoughts -  PHQ-9 Score -  Difficult doing work/chores -    Health Maintenance Due  Topic Date Due   Zoster Vaccines- Shingrix (1 of 2) Never done   COVID-19 Vaccine (3 - Booster for Moderna series) 06/22/2020     ROS: Per HPI, otherwise a complete review of systems was negative.   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" (03/23/2016)   History of kidney stones    "passed  them"   Hypertension    Hypothyroidism    Iron deficiency anemia    Seasonal allergies    "fall and spring; mild" (03/23/2016)   Patient Active Problem List   Diagnosis Date Noted   Benign essential HTN 04/11/2020   Hypothyroidism (acquired) 04/11/2020   Gastroesophageal reflux disease without esophagitis 12/06/2017   Seasonal allergic rhinitis 12/06/2017   Colon  polyp, hyperplastic 03/23/2016   Past Surgical History:  Procedure Laterality Date   APPENDECTOMY  03/23/2016   COLON RESECTION N/A 03/23/2016   Procedure: LAPAROSCOPIC CECECTOMY;  Surgeon: Coralie Keens, MD;  Location: [facility];  Service: General;  Laterality: N/A;   COLON SURGERY  03/23/2016   LAPAROSCOPIC CECECTOMY     COLONOSCOPY     ESOPHAGOGASTRODUODENOSCOPY ENDOSCOPY     TONSILLECTOMY     VASECTOMY      Family History  Problem Relation Age of Onset   Cancer Mother    Hyperlipidemia Mother    Cancer Father    Hyperlipidemia Father     Medications- reviewed and updated Current Outpatient Medications  Medication Sig Dispense Refill   cetirizine (ZYRTEC) 10 MG tablet Take 10 mg by mouth daily as needed.     cholecalciferol (VITAMIN D) 1000 units tablet Take 1,000 Units by mouth daily.     Multiple Minerals-Vitamins (CAL-MAG-ZINC-D PO) Take by mouth.     Multiple Vitamins-Minerals (MULTIVITAMIN MEN PO) Take by mouth.     amLODipine (NORVASC) 5 MG tablet Take 1 tablet (5 mg total) by mouth daily. 90 tablet 3   levothyroxine (SYNTHROID) 75 MCG tablet TAKE 1 TABLET ON MONDAY, TUESDAY, THURSDAY , FRIDAY AND SATURDAY. AND TAKE 1 AND 1/2 [facility] AND SUNDAY 114 tablet 3   UNABLE TO FIND Vitafusion (Patient not taking: Reported on 04/07/2021)     No current facility-administered medications for this visit.    Allergies-reviewed and updated Allergies  Allergen Reactions   Ciprofloxacin Anxiety   Doxycycline Itching    Social History   Socioeconomic History   Marital status: Widowed    Spouse name: Not on file   Number of children: Not on file   Years of education: Not on file   Highest education level: Not on file  Occupational History   Not on file  Tobacco Use   Smoking status: Never   Smokeless tobacco: Never  Substance and Sexual Activity   Alcohol use: No    Alcohol/week: 0.0 standard drinks   Drug use: No   Sexual activity: Yes  Other Topics Concern    Not on file  Social History Narrative   Not on file   Social Determinants of Health   Financial Resource Strain: Not on file  Food Insecurity: Not on file  Transportation Needs: Not on file  Physical Activity: Not on file  Stress: Not on file  Social Connections: Not on file        Objective:  Physical Exam: BP 127/79 (BP Location: Left Arm)   Pulse 85   Temp 98 F (36.7 C) (Temporal)   Ht 5\' 9"  (1.753 m)   Wt 168 lb (76.2 kg)   SpO2 98%   BMI 24.81 kg/m   Body mass index is 24.81 kg/m. Wt Readings from Last 3 Encounters:  04/07/21 168 lb (76.2 kg)  04/11/20 170 lb 9.6 oz (77.4 kg)  03/23/16 179 lb (81.2 kg)   Gen: NAD, resting comfortably HEENT: TMs normal bilaterally. OP clear. No thyromegaly noted.  CV: RRR with no murmurs appreciated Pulm: NWOB,  CTAB with no crackles, wheezes, or rhonchi GI: Normal bowel sounds present. Soft, Nontender, Nondistended. MSK: no edema, cyanosis, or clubbing noted Skin: warm, dry. 1-46mm cyst on right ear.  Neuro: CN2-12 grossly intact. Strength 5/5 in upper and lower extremities. Reflexes symmetric and intact bilaterally.  Psych: Normal affect and thought content     I,Savera Zaman,acting as a scribe for Dimas Chyle, MD.,have documented all relevant documentation on the behalf of Dimas Chyle, MD,as directed by  Dimas Chyle, MD while in the presence of Dimas Chyle, MD.   I, Dimas Chyle, MD, have reviewed all documentation for this visit. The documentation on 04/07/21 for the exam, diagnosis, procedures, and orders are all accurate and complete.  Time Spent: 45 minutes of total time was spent on the date of the encounter performing the following actions: chart review prior to seeing the patient including records from previous PCP, obtaining history, performing a medically necessary exam, counseling on the treatment plan, placing orders, and documenting in our EHR.    Algis Greenhouse. Jerline Pain, MD 04/07/2021 9:55 AM

## 2021-04-08 ENCOUNTER — Encounter: Payer: Self-pay | Admitting: Family Medicine

## 2021-04-08 DIAGNOSIS — R739 Hyperglycemia, unspecified: Secondary | ICD-10-CM | POA: Insufficient documentation

## 2021-04-08 DIAGNOSIS — E785 Hyperlipidemia, unspecified: Secondary | ICD-10-CM | POA: Insufficient documentation

## 2021-04-08 NOTE — Progress Notes (Signed)
Please inform patient of the following:  Cholesterol and blood sugar are a bit elevated compared to last time. Do not need to start any medications but he should continue working on diet and exercise and we can recheck in a year.  All of his other labs are NORMAL.  Richard Conrad. Jerline Pain, MD 04/08/2021 8:04 AM

## 2021-05-11 ENCOUNTER — Other Ambulatory Visit: Payer: Self-pay | Admitting: Family Medicine

## 2021-10-11 ENCOUNTER — Encounter (HOSPITAL_BASED_OUTPATIENT_CLINIC_OR_DEPARTMENT_OTHER): Payer: Self-pay | Admitting: Emergency Medicine

## 2021-10-11 ENCOUNTER — Emergency Department (HOSPITAL_BASED_OUTPATIENT_CLINIC_OR_DEPARTMENT_OTHER)
Admission: EM | Admit: 2021-10-11 | Discharge: 2021-10-11 | Disposition: A | Discharge status: Home / Self Care | Payer: Self-pay | Attending: Emergency Medicine | Admitting: Emergency Medicine

## 2021-10-11 ENCOUNTER — Other Ambulatory Visit: Payer: Self-pay

## 2021-10-11 ENCOUNTER — Emergency Department (HOSPITAL_BASED_OUTPATIENT_CLINIC_OR_DEPARTMENT_OTHER): Payer: Self-pay

## 2021-10-11 DIAGNOSIS — I10 Essential (primary) hypertension: Secondary | ICD-10-CM | POA: Insufficient documentation

## 2021-10-11 DIAGNOSIS — E039 Hypothyroidism, unspecified: Secondary | ICD-10-CM | POA: Insufficient documentation

## 2021-10-11 DIAGNOSIS — S52571A Other intraarticular fracture of lower end of right radius, initial encounter for closed fracture: Secondary | ICD-10-CM | POA: Insufficient documentation

## 2021-10-11 DIAGNOSIS — S52601A Unspecified fracture of lower end of right ulna, initial encounter for closed fracture: Secondary | ICD-10-CM | POA: Insufficient documentation

## 2021-10-11 DIAGNOSIS — W1789XA Other fall from one level to another, initial encounter: Secondary | ICD-10-CM | POA: Insufficient documentation

## 2021-10-11 MED ORDER — BUPIVACAINE HCL (PF) 0.5 % IJ SOLN
10.0000 mL | Freq: Once | INTRAMUSCULAR | Status: AC
  Administered 2021-10-11: 10 mL
  Filled 2021-10-11: qty 10

## 2021-10-11 MED ORDER — OXYCODONE-ACETAMINOPHEN 5-325 MG PO TABS
1.0000 | ORAL_TABLET | Freq: Once | ORAL | Status: AC
Start: 2021-10-11 — End: 2021-10-11
  Administered 2021-10-11: 1 via ORAL
  Filled 2021-10-11: qty 1

## 2021-10-11 MED ORDER — IBUPROFEN 800 MG PO TABS: 800.0000 mg | ORAL_TABLET | Freq: Three times a day (TID) | ORAL | 0 refills | Status: DC

## 2021-10-11 MED ORDER — OXYCODONE-ACETAMINOPHEN 5-325 MG PO TABS: 1.0000 | ORAL_TABLET | Freq: Four times a day (QID) | ORAL | 0 refills | Status: AC | PRN

## 2021-10-11 MED ORDER — LIDOCAINE-EPINEPHRINE (PF) 2 %-1:200000 IJ SOLN
20.0000 mL | Freq: Once | INTRAMUSCULAR | Status: AC
  Administered 2021-10-11: 20 mL
  Filled 2021-10-11: qty 20

## 2021-10-11 NOTE — ED Provider Notes (Signed)
[facility] EMERGENCY DEPARTMENT Provider Note   CSN: [id number] Arrival date & time: 10/11/21  1520     History PMH: HTN, Hypothyroidism, IDA, GERD Chief Complaint  Patient presents with   Wrist Injury    Richard Conrad is a 61 y.o. male. Patient presents to the emergency department after a fall from his pickup truck.  He was trying to lift surgery when he got his right foot caught on the edge and fell off.  He said he braced his fall on his right and left wrist but more so his right side.  He has had excruciating pain following the fall and has had difficulty moving his fingers as well as having any movement in his right wrist.  He did not hit any other parts of his body including his head.  He is not any blood thinners.  He denies any numbness or tingling in his extremities.   Wrist Injury      Home Medications Prior to Admission medications   Medication Sig Start Date End Date Taking? Authorizing Provider  ibuprofen (ADVIL) 800 MG tablet Take 1 tablet (800 mg total) by mouth 3 (three) times daily. 10/11/21  Yes Malvin Johns, MD  oxyCODONE-acetaminophen (PERCOCET/ROXICET) 5-325 MG tablet Take 1 tablet by mouth every 6 (six) hours as needed for up to 5 days for severe pain. 10/11/21 10/16/21 Yes Makenzi Bannister, Adora Fridge, PA-C  amLODipine (NORVASC) 5 MG tablet Take 1 tablet (5 mg total) by mouth daily. 04/07/21   Vivi Barrack, MD  cetirizine (ZYRTEC) 10 MG tablet Take 10 mg by mouth daily as needed.    [provider]  cholecalciferol (VITAMIN D) 1000 units tablet Take 1,000 Units by mouth daily.    [provider]  levothyroxine (SYNTHROID) 75 MCG tablet TAKE 1 TABLET ON MONDAY, TUESDAY, THURSDAY , [place]. AND TAKE 1 AND 1/2 [facility] AND SUNDAY 04/07/21   Vivi Barrack, MD  Multiple Minerals-Vitamins (CAL-MAG-ZINC-D PO) Take by mouth.    [provider]  Multiple Vitamins-Minerals (MULTIVITAMIN MEN PO) Take by mouth.    [provider]  UNABLE TO FIND Vitafusion Patient not taking: Reported on 04/07/2021    [provider]      Allergies    Ciprofloxacin and Doxycycline    Review of Systems   Review of Systems  Musculoskeletal:  Positive for arthralgias.  All other systems reviewed and are negative.   Physical Exam Updated Vital Signs BP (!) 150/87   Pulse 90   Temp 98.3 F (36.8 C) (Oral)   Resp 14   Ht '5\' 9"'$  (1.753 m)   Wt 73.9 kg   SpO2 96%   BMI 24.07 kg/m  Physical Exam Vitals and nursing note reviewed.  Constitutional:      General: He is not in acute distress.    Appearance: Normal appearance. He is well-developed. He is not ill-appearing, toxic-appearing or diaphoretic.  HENT:     Head: Normocephalic and atraumatic.     Comments: No evidence of external head injury    Nose: No nasal deformity.     Mouth/Throat:     Lips: Pink. No lesions.  Eyes:     General: Gaze aligned appropriately. No scleral icterus.       Right eye: No discharge.        Left eye: No discharge.     Conjunctiva/sclera: Conjunctivae normal.     Right eye: Right conjunctiva is not injected. No exudate or hemorrhage.  Left eye: Left conjunctiva is not injected. No exudate or hemorrhage. Cardiovascular:     Pulses: Normal pulses.  Pulmonary:     Effort: Pulmonary effort is normal. No respiratory distress.  Musculoskeletal:     Comments: Notable deformity to the right wrist with absence of range of motion.  He can slightly wiggle all of his fingers.  He has intact sensation.  Radial pulse 2+.  No tenderness going up into the right elbow.  Left wrist with full range of motion, normal sensation, 2+ radial pulse.    Skin:    General: Skin is warm and dry.  Neurological:     Mental Status: He is alert and oriented to person, place, and time.  Psychiatric:        Mood and Affect: Mood normal.        Speech: Speech normal.        Behavior: Behavior normal. Behavior is cooperative.     ED  Results / Procedures / Treatments   Labs (all labs ordered are listed, but only abnormal results are displayed) Labs Reviewed - No data to display  EKG None  Radiology DG Wrist 2 Views Right  Result Date: 10/11/2021 CLINICAL DATA:  Post reduction EXAM: RIGHT WRIST - 2 VIEW COMPARISON:  10/11/2021 FINDINGS: Cast material has been applied about the wrist, with essentially unchanged alignment of comminuted, persistently impacted fractures of the distal right radius. The distal right ulna is intact. Soft tissue edema. IMPRESSION: Essentially unchanged alignment of comminuted, persistently impacted fractures of the distal right radius. Electronically Signed   By: Delanna Ahmadi M.D.   On: 10/11/2021 18:53   DG Wrist Complete Left  Result Date: 10/11/2021 CLINICAL DATA:  Patient fell from the back of his truck, catching himself with his right arm. Right wrist pain and deformity. Left wrist pain as well. EXAM: LEFT WRIST - COMPLETE 3+ VIEW COMPARISON:  None Available. FINDINGS: No fracture or bone lesion. Wrist joints are normally spaced and aligned. Soft tissues are unremarkable. IMPRESSION: Negative. Electronically Signed   By: Lajean Manes M.D.   On: 10/11/2021 16:16   DG Elbow Complete Right  Result Date: 10/11/2021 CLINICAL DATA:  Patient fell from the back of his truck, catching himself with his right arm. Right wrist pain and deformity. Left wrist pain as well. EXAM: RIGHT ELBOW - COMPLETE 3+ VIEW COMPARISON:  None Available. FINDINGS: There is no evidence of fracture, dislocation, or joint effusion. There is no evidence of arthropathy or other focal bone abnormality. Soft tissues are unremarkable. IMPRESSION: Negative. Electronically Signed   By: Lajean Manes M.D.   On: 10/11/2021 16:16   DG Wrist Complete Right  Result Date: 10/11/2021 CLINICAL DATA:  Patient fell from the back of his truck, catching himself with his right arm. Right wrist pain and deformity. EXAM: RIGHT WRIST - COMPLETE 3+  VIEW COMPARISON:  None Available. FINDINGS: There is a significantly comminuted, displaced and impacted fracture of the distal radius. Fracture is predominantly transverse with a sagittal oblique component, the latter extending to the radial metadiaphysis. The comminuted distal fracture components have impacted approximately 9 mm. There is dorsal displacement of the primary distal, articular component, proximally 1.1 cm posteriorly. Carpus has displaced along with the distal radial components as has the distal ulna. There is associated ulnar styloid fracture, displaced in a radial direction. Diffuse surrounding soft tissue swelling. IMPRESSION: 1. Comminuted, impacted and dorsally displaced fracture of the distal radius with an associated displaced ulnar styloid fracture. No dislocation.  Electronically Signed   By: Lajean Manes M.D.   On: 10/11/2021 16:15    Procedures .Ortho Injury Treatment  Date/Time: 10/11/2021 8:55 PM  Performed by: Adolphus Birchwood, PA-C Authorized by: Adolphus Birchwood, PA-C   Consent:    Consent obtained:  Verbal   Consent given by:  Patient   Risks discussed:  Irreducible dislocation, recurrent dislocation, nerve damage, restricted joint movement and vascular damage   Alternatives discussed:  No treatment, immobilization, alternative treatment and delayed treatmentInjury location: wrist Location details: right wrist Injury type: fracture Fracture type: distal radius and ulnar styloid Pre-procedure neurovascular assessment: neurovascularly intact Pre-procedure distal perfusion: normal Pre-procedure neurological function: normal Pre-procedure range of motion: reduced Anesthesia: hematoma block  Anesthesia: Local anesthesia used: yes Local Anesthetic: lidocaine 1% without epinephrine and bupivacaine 0.25% without epinephrine Anesthetic total: 10 mL  Patient sedated: NoManipulation performed: yes Skin traction used: yes Skeletal traction used: yes Reduction  successful: no X-ray confirmed reduction: yes Immobilization: splint Splint type: sugar tong Splint Applied by: ED Tech Post-procedure neurovascular assessment: post-procedure neurovascularly intact Post-procedure distal perfusion: normal Post-procedure neurological function: normal Post-procedure range of motion: unchanged      Medications Ordered in ED Medications  oxyCODONE-acetaminophen (PERCOCET/ROXICET) 5-325 MG per tablet 1 tablet (1 tablet Oral Given 10/11/21 1552)  lidocaine-EPINEPHrine (XYLOCAINE W/EPI) 2 %-1:200000 (PF) injection 20 mL (20 mLs Other Given by Other 10/11/21 1721)  bupivacaine(PF) (MARCAINE) 0.5 % injection 10 mL (10 mLs Infiltration Given by Other 10/11/21 1721)    ED Course/ Medical Decision Making/ A&P Clinical Course as of 10/11/21 2058  Sun Oct 11, 2021  1703 Spoke with Dr. Stann Mainland. Recommends Hematoma block. Reduce it. splint with sugar tong [place] f/u [GL]    Clinical Course User Index [GL] Sherre Poot Adora Fridge, PA-C                           Medical Decision Making Amount and/or Complexity of Data Reviewed Radiology: ordered.  Risk Prescription drug management.    MDM  This is a 61 y.o. male who presents to the ED with fall with [place] injury to bilateral wrists.   My Impression, Plan, and ED Course:  Well-appearing male with normal vitals.  He has notable right wrist swelling and deformity on exam.  Neurovascularly intact.  He also expresses some irritation with his left wrist but he has full range of motion of this and neurovascularly intact.  Request x-ray of the left wrist as well as the right wrist. No other injuries noted on exam.  I personally ordered, reviewed, and interpreted all laboratory work and imaging and agree with radiologist interpretation. Results interpreted below:  Right wrist XR reveals comminuted, impacted and dorsally displaced fracture of distal radius with an associated displaced ulnar styloid fracture. No  dislocation.  Right elbow XR unremarkable Left wrist XR unremarkable.  '@1703'$ , I consulted with Dr. Stann Mainland from orthopedic surgery. Recommends reduction here with hematoma block and application of sugar tong splint. Patient will need follow up with Dr. Tempie Donning.   Procedure outlined above.   Repeat xray showed slightly improved angulation but still impacted and displaced. Continues to be neurovascularly intact. Patient will need follow up with Dr. Tempie Donning to discuss surgical options. I offered patient Percocet to manage pain, but he refuses and wants to use NSAIDs instead which I think is reasonable. Return precautions are discussed with patient.  Charting Requirements Additional history is obtained from:  Independent historian External Records from outside  source obtained and reviewed including: Most recent visit for thyroid issues in 2022.  Social Determinants of Health:   Non insured Pertinant PMH that complicates patient's illness: n/a  Patient Care Problems that were addressed during this visit: - Distal Right radial fracture: Acute illness with complication - Distal right ulnar fracture: Acute illness with complication Medications given in ED: Percocet Reevaluation of the patient after these medicines showed that the patient improved I have reviewed home medications and made changes accordingly. Critical Care Interventions: Reduction of displaced radial fracture Consultations: orthopedic surgery Disposition: discharge with ortho follow up  This is a shared visit with my attending physician, Dr. Tamera Punt.  We have discussed this patient and they have independently evaluated this patient. The plan was altered or changed as needed.  Portions of this note were generated with [employer]. Dictation errors may occur despite best attempts at proofreading.    Final Clinical Impression(s) / ED Diagnoses Final diagnoses:  Other closed intra-articular fracture of distal end of  right radius, initial encounter  Closed fracture of distal end of right ulna, unspecified fracture morphology, initial encounter    Rx / DC Orders ED Discharge Orders          Ordered    oxyCODONE-acetaminophen (PERCOCET/ROXICET) 5-325 MG tablet  Every 6 hours PRN        10/11/21 1854    ibuprofen (ADVIL) 800 MG tablet  3 times daily        10/11/21 1917              Sheila Oats 10/11/21 2058    Malvin Johns, MD 10/11/21 2246

## 2021-10-11 NOTE — ED Notes (Signed)
Patient transported to X-ray 

## 2021-10-11 NOTE — ED Triage Notes (Signed)
Patient states he fell from the back of his truck and caught himself with his right arm. Deformity noted to right wrist.

## 2021-10-11 NOTE — Discharge Instructions (Signed)
Your x-ray revealed a radial ulnar fracture.  We attempted to reduce this in the emergency department and it looks slightly better.  He will need to follow-up with the orthopedic doctor, Dr. Tempie Donning.  Please call him tomorrow to schedule follow-up visit.  Please keep splint in place and dry.  I prescribed you oral pain medication to take as needed.  You can also alternate Tylenol and ibuprofen. Please only take the Percocet as prescribed. Please do not take prior to driving or operating heavy machinery as it can  make you drowsy.   If you start experiencing worsening excruciating pain or feel that your cast is too tight, or develop numbness in your right upper extremity please return to the emergency department.

## 2021-10-13 ENCOUNTER — Encounter: Payer: Self-pay | Admitting: Orthopedic Surgery

## 2021-10-13 ENCOUNTER — Ambulatory Visit (INDEPENDENT_AMBULATORY_CARE_PROVIDER_SITE_OTHER): Payer: Self-pay | Admitting: Orthopedic Surgery

## 2021-10-13 DIAGNOSIS — S52501A Unspecified fracture of the lower end of right radius, initial encounter for closed fracture: Secondary | ICD-10-CM | POA: Insufficient documentation

## 2021-10-13 DIAGNOSIS — S52571A Other intraarticular fracture of lower end of right radius, initial encounter for closed fracture: Secondary | ICD-10-CM

## 2021-10-13 NOTE — Addendum Note (Signed)
Addended by: Sherilyn Cooter on: 10/13/2021 04:55 PM   Modules accepted: Orders

## 2021-10-13 NOTE — Progress Notes (Signed)
Office Visit Note   Patient: Richard Conrad           Date of Birth: [birth year]           MRN: [id number] Visit Date: 10/13/2021              Requested by: Vivi Barrack, MD [address] [place],  [place] [address] PCP: Vivi Barrack, MD   Assessment & Plan: Visit Diagnoses:  1. Other closed intra-articular fracture of distal end of right radius, initial encounter     Plan: We reviewed patient's x-rays which demonstrated a significantly comminuted intra-articular distal radius fracture.  There is significant metaphyseal comminution.  We discussed that this fracture would best be treated with open reduction internal fixation.  We discussed volar plate fixation with or without adjunct dorsal spanning plate fixation.  We reviewed the risks of surgery including bleeding, infection, damage to neurovascular structures, and nonunion, malunion, need for additional surgery.   After discussion, he would like to proceed with surgical fixation of his fracture.  Follow-Up Instructions: No follow-ups on file.   Orders:  No orders of the defined types were placed in this encounter.  No orders of the defined types were placed in this encounter.     Procedures: No procedures performed   Clinical Data: No additional findings.   Subjective: Chief Complaint  Patient presents with   Right Wrist - Injury    RIGHT handed, DOI: 10/11/21, fell off back of truck     This is a 61 year old right-hand-dominant male who presents for ER follow-up of a right distal radius fracture.  Patient was helping his son unload some furniture from the back of a truck when he lost his balance and fell.  This was on Sunday.  He had immediate pain and deformity in the wrist.  Seen in the ER where an attempt was made at reduction and he was placed in a sugar-tong splint.  The splint is very tight today.  He denies any numbness or paresthesias.  Denies any pain elsewhere in the extremity.  Injury    Review of  Systems   Objective: Vital Signs: BP (!) 141/91 (BP Location: Left Arm, Patient Position: Sitting)   Pulse 85   Ht '5\' 9"'$  (1.753 m)   Wt 162 lb 14.7 oz (73.9 kg)   BMI 24.06 kg/m   Physical Exam Constitutional:      Appearance: Normal appearance.  Cardiovascular:     Rate and Rhythm: Normal rate.     Pulses: Normal pulses.  Pulmonary:     Effort: Pulmonary effort is normal.  Skin:    General: Skin is warm and dry.     Capillary Refill: Capillary refill takes less than 2 seconds.  Neurological:     Mental Status: He is alert.     Right Hand Exam   Other  Erythema: absent Sensation: normal Pulse: present  Comments:  Splint clean and dry but very tight.  Diffuse hand swelling distal to splint.  Fingers diffusely swollen.  Full ROM of fingers.  Sensation intact in all fingers and symmetric to the uninjured side.       Specialty Comments:  No specialty comments available.  Imaging: No results found.   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" (03/23/2016)   History of kidney stones    "passed them"   Hypertension    Hypothyroidism    Iron deficiency anemia    Seasonal allergies    "fall and spring; mild" (03/23/2016)    Family History  Problem Relation Age of Onset   Cancer Mother    Hyperlipidemia Mother    Cancer Father    Hyperlipidemia Father     Past Surgical History:  Procedure Laterality Date   APPENDECTOMY  03/23/2016   COLON RESECTION N/A  03/23/2016   Procedure: LAPAROSCOPIC CECECTOMY;  Surgeon: Coralie Keens, MD;  Location: [facility];  Service: General;  Laterality: N/A;   COLON SURGERY  03/23/2016   LAPAROSCOPIC CECECTOMY     COLONOSCOPY     ESOPHAGOGASTRODUODENOSCOPY ENDOSCOPY     TONSILLECTOMY     VASECTOMY     Social History   Occupational History   Not on file  Tobacco Use   Smoking status: Never   Smokeless tobacco: Never  Substance and Sexual Activity   Alcohol use: No    Alcohol/week: 0.0 standard drinks of alcohol   Drug use: No   Sexual activity: Yes

## 2021-10-13 NOTE — H&P (View-Only) (Signed)
Office Visit Note   Patient: Richard Conrad           Date of Birth: Jul 09, 1960           MRN: [id number] Visit Date: 10/13/2021              Requested by: Vivi Barrack, MD [address] [place],  [place] [address] PCP: Vivi Barrack, MD   Assessment & Plan: Visit Diagnoses:  1. Other closed intra-articular fracture of distal end of right radius, initial encounter     Plan: We reviewed patient's x-rays which demonstrated a significantly comminuted intra-articular distal radius fracture.  There is significant metaphyseal comminution.  We discussed that this fracture would best be treated with open reduction internal fixation.  We discussed volar plate fixation with or without adjunct dorsal spanning plate fixation.  We reviewed the risks of surgery including bleeding, infection, damage to neurovascular structures, and nonunion, malunion, need for additional surgery.   After discussion, he would like to proceed with surgical fixation of his fracture.  Follow-Up Instructions: No follow-ups on file.   Orders:  No orders of the defined types were placed in this encounter.  No orders of the defined types were placed in this encounter.     Procedures: No procedures performed   Clinical Data: No additional findings.   Subjective: Chief Complaint  Patient presents with   Right Wrist - Injury    RIGHT handed, DOI: 10/11/21, fell off back of truck     This is a 61 year old right-hand-dominant male who presents for ER follow-up of a right distal radius fracture.  Patient was helping his son unload some furniture from the back of a truck when he lost his balance and fell.  This was on Sunday.  He had immediate pain and deformity in the wrist.  Seen in the ER where an attempt was made at reduction and he was placed in a sugar-tong splint.  The splint is very tight today.  He denies any numbness or paresthesias.  Denies any pain elsewhere in the extremity.  Injury    Review of  Systems   Objective: Vital Signs: BP (!) 141/91 (BP Location: Left Arm, Patient Position: Sitting)   Pulse 85   Ht '5\' 9"'$  (1.753 m)   Wt 162 lb 14.7 oz (73.9 kg)   BMI 24.06 kg/m   Physical Exam Constitutional:      Appearance: Normal appearance.  Cardiovascular:     Rate and Rhythm: Normal rate.     Pulses: Normal pulses.  Pulmonary:     Effort: Pulmonary effort is normal.  Skin:    General: Skin is warm and dry.     Capillary Refill: Capillary refill takes less than 2 seconds.  Neurological:     Mental Status: He is alert.     Right Hand Exam   Other  Erythema: absent Sensation: normal Pulse: present  Comments:  Splint clean and dry but very tight.  Diffuse hand swelling distal to splint.  Fingers diffusely swollen.  Full ROM of fingers.  Sensation intact in all fingers and symmetric to the uninjured side.       Specialty Comments:  No specialty comments available.  Imaging: No results found.   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" (03/23/2016)   History of kidney stones    "passed them"   Hypertension    Hypothyroidism    Iron deficiency anemia    Seasonal allergies    "fall and spring; mild" (03/23/2016)    Family History  Problem Relation Age of Onset   Cancer Mother    Hyperlipidemia Mother    Cancer Father    Hyperlipidemia Father     Past Surgical History:  Procedure Laterality Date   APPENDECTOMY  03/23/2016   COLON RESECTION N/A  03/23/2016   Procedure: LAPAROSCOPIC CECECTOMY;  Surgeon: Coralie Keens, MD;  Location: [facility];  Service: General;  Laterality: N/A;   COLON SURGERY  03/23/2016   LAPAROSCOPIC CECECTOMY     COLONOSCOPY     ESOPHAGOGASTRODUODENOSCOPY ENDOSCOPY     TONSILLECTOMY     VASECTOMY     Social History   Occupational History   Not on file  Tobacco Use   Smoking status: Never   Smokeless tobacco: Never  Substance and Sexual Activity   Alcohol use: No    Alcohol/week: 0.0 standard drinks of alcohol   Drug use: No   Sexual activity: Yes

## 2021-10-14 ENCOUNTER — Ambulatory Visit
Admission: RE | Admit: 2021-10-14 | Discharge: 2021-10-14 | Disposition: A | Discharge status: Home / Self Care | Payer: Self-pay | Source: Ambulatory Visit | Attending: Orthopedic Surgery | Admitting: Orthopedic Surgery

## 2021-10-14 DIAGNOSIS — S52571A Other intraarticular fracture of lower end of right radius, initial encounter for closed fracture: Secondary | ICD-10-CM

## 2021-10-20 ENCOUNTER — Telehealth: Payer: Self-pay | Admitting: Orthopedic Surgery

## 2021-10-20 ENCOUNTER — Encounter (HOSPITAL_COMMUNITY): Payer: Self-pay | Admitting: Orthopedic Surgery

## 2021-10-20 ENCOUNTER — Other Ambulatory Visit: Payer: Self-pay

## 2021-10-20 NOTE — Telephone Encounter (Signed)
Patient was concerned about the anesthesia, he wanted to know if he would get the nerve block and something to make him sleepy or be put totally under, I spoke with Dr. Tempie Donning, he states that this is usually up to the anesthesiologist as to which it would be, I advised the patient of this and advised that he could let them know what he prefers to do, and that he would see them prior to surgery.  Pt states that he understands.

## 2021-10-20 NOTE — Telephone Encounter (Signed)
Pt called requesting a call asking for a call from [place]. Pt states he will be having surgery tomorrow and has a medical question. Please call pt at [phone].

## 2021-10-20 NOTE — Pre-Procedure Instructions (Signed)
[place], [place] - [address] [place] [address] Phone: [phone] Fax: [phone]  [facility] - [place], [place] - [address] [place] [address] Phone: [phone] Fax: [place] [phone] - [address] [place] [address] Phone: [phone] Fax: [phone]   PCP - Vivi Barrack, MD Cardiologist - Denies  EKG - [place] with Ermalene Postin. Ok for patient to have solid food until 0800 and clears until 1300  Anesthesia review: N  Patient verbally denies any shortness of breath, fever, cough and chest pain during phone call   -------------  SDW INSTRUCTIONS given:  Your procedure is scheduled on 10/21/21.  Report to [facility] Main Entrance "A" at 130 P.M., and check in at the Admitting office.  Call this number if you have problems the morning of surgery:  [phone]   Remember:  Do not eat after midnight the night before your surgery  You may drink clear liquids until 1300 the morning of your surgery.   Clear liquids allowed are: Water, Non-Citrus Juices (without pulp), Carbonated Beverages, Clear Tea, Black Coffee Only, and Gatorade    Take these medicines the morning of surgery with A SIP OF WATER  levothyroxine (SYNTHROID)  acetaminophen (TYLENOL) -if needed  As of today, STOP taking any Aspirin (unless otherwise instructed by your surgeon) Aleve, Naproxen, Ibuprofen, Motrin, Advil, Goody's, BC's, all herbal medications, fish oil, and all vitamins.                      Do not wear jewelry, make up, or nail polish            Do not wear lotions, powders, perfumes/colognes, or deodorant.            Do not shave 48 hours prior to surgery.  Men may shave face and neck.            Do not bring valuables to the hospital.            [facility] is not responsible for any belongings or valuables.  Do NOT  Smoke (Tobacco/Vaping) 24 hours prior to your procedure If you use a CPAP at night, you may bring all equipment for your overnight stay.   Contacts, glasses, dentures or bridgework may not be worn into surgery.      For patients admitted to the hospital, discharge time will be determined by your treatment team.   Patients discharged the day of surgery will not be allowed to drive home, and someone needs to stay with them for 24 hours.    Special instructions:   [facility]- Preparing For Surgery  Before surgery, you can play an important role. Because skin is not sterile, your skin needs to be as free of germs as possible. You can reduce the number of germs on your skin by washing with CHG (chlorahexidine gluconate) Soap before surgery.  CHG is an antiseptic cleaner which kills germs and bonds with the skin to continue killing germs even after washing.    Oral Hygiene is also important to reduce your risk of infection.  Remember - BRUSH YOUR TEETH THE MORNING OF SURGERY WITH YOUR REGULAR TOOTHPASTE  Please do not use if you have an allergy to CHG or antibacterial soaps. If your skin becomes reddened/irritated stop using the CHG.  Do not shave (including legs and underarms) for at least 48 hours prior to first CHG shower. It is OK to shave your face.  Please follow these instructions carefully.   Shower the NIGHT BEFORE SURGERY and the MORNING OF SURGERY with DIAL Soap.   Pat yourself dry with a CLEAN TOWEL.  Wear CLEAN PAJAMAS to bed the night before surgery  Place CLEAN SHEETS on your bed the night of your first shower and DO NOT SLEEP WITH PETS.   Day of Surgery: Please shower morning of surgery  Wear Clean/Comfortable clothing the morning of surgery Do not apply any deodorants/lotions.   Remember to brush your teeth WITH YOUR REGULAR TOOTHPASTE.   Questions were answered. Patient verbalized understanding of instructions.

## 2021-10-21 ENCOUNTER — Other Ambulatory Visit: Payer: Self-pay

## 2021-10-21 ENCOUNTER — Ambulatory Visit (HOSPITAL_COMMUNITY): Payer: Self-pay | Admitting: Anesthesiology

## 2021-10-21 ENCOUNTER — Ambulatory Visit (HOSPITAL_BASED_OUTPATIENT_CLINIC_OR_DEPARTMENT_OTHER): Payer: Self-pay | Admitting: Anesthesiology

## 2021-10-21 ENCOUNTER — Encounter (HOSPITAL_COMMUNITY): Payer: Self-pay | Admitting: Orthopedic Surgery

## 2021-10-21 ENCOUNTER — Encounter (HOSPITAL_COMMUNITY)
Admission: RE | Disposition: A | Discharge status: Home / Self Care | Payer: Self-pay | Source: Ambulatory Visit | Attending: Orthopedic Surgery

## 2021-10-21 ENCOUNTER — Ambulatory Visit (HOSPITAL_COMMUNITY): Payer: Self-pay

## 2021-10-21 ENCOUNTER — Ambulatory Visit (HOSPITAL_COMMUNITY)
Admission: RE | Admit: 2021-10-21 | Discharge: 2021-10-21 | Disposition: A | Discharge status: Home / Self Care | Payer: Self-pay | Source: Ambulatory Visit | Attending: Orthopedic Surgery | Admitting: Orthopedic Surgery

## 2021-10-21 DIAGNOSIS — W1789XA Other fall from one level to another, initial encounter: Secondary | ICD-10-CM | POA: Insufficient documentation

## 2021-10-21 DIAGNOSIS — S52571A Other intraarticular fracture of lower end of right radius, initial encounter for closed fracture: Secondary | ICD-10-CM | POA: Insufficient documentation

## 2021-10-21 DIAGNOSIS — K219 Gastro-esophageal reflux disease without esophagitis: Secondary | ICD-10-CM | POA: Insufficient documentation

## 2021-10-21 DIAGNOSIS — S52611A Displaced fracture of right ulna styloid process, initial encounter for closed fracture: Secondary | ICD-10-CM | POA: Insufficient documentation

## 2021-10-21 DIAGNOSIS — I10 Essential (primary) hypertension: Secondary | ICD-10-CM | POA: Insufficient documentation

## 2021-10-21 DIAGNOSIS — E039 Hypothyroidism, unspecified: Secondary | ICD-10-CM | POA: Insufficient documentation

## 2021-10-21 DIAGNOSIS — S52501A Unspecified fracture of the lower end of right radius, initial encounter for closed fracture: Secondary | ICD-10-CM

## 2021-10-21 DIAGNOSIS — S52591A Other fractures of lower end of right radius, initial encounter for closed fracture: Secondary | ICD-10-CM | POA: Insufficient documentation

## 2021-10-21 LAB — BASIC METABOLIC PANEL
Anion gap: 10 (ref 5–15)
BUN: 17 mg/dL (ref 6–20)
CO2: 24 mmol/L (ref 22–32)
Calcium: 9.4 mg/dL (ref 8.9–10.3)
Chloride: 99 mmol/L (ref 98–111)
Creatinine, Ser: 1.04 mg/dL (ref 0.61–1.24)
GFR, Estimated: >60 mL/min (ref >60)
Glucose, Bld: 96 mg/dL (ref 70–99)
Potassium: 3.7 mmol/L (ref 3.5–5.1)
Sodium: 133 mmol/L — ABNORMAL LOW (ref 135–145)

## 2021-10-21 LAB — CBC
HCT: 35.3 % — ABNORMAL LOW (ref 39.0–52.0)
Hemoglobin: 11.8 g/dL — ABNORMAL LOW (ref 13.0–17.0)
MCH: 31.5 pg (ref 26.0–34.0)
MCHC: 33.4 g/dL (ref 30.0–36.0)
MCV: 94.1 fL (ref 80.0–100.0)
Platelets: 369 10*3/uL (ref 150–400)
RBC: 3.75 MIL/uL — ABNORMAL LOW (ref 4.22–5.81)
RDW: 13.1 % (ref 11.5–15.5)
WBC: 11 10*3/uL — ABNORMAL HIGH (ref 4.0–10.5)
nRBC: 0 % (ref 0.0–0.2)

## 2021-10-21 SURGERY — OPEN REDUCTION INTERNAL FIXATION (ORIF) DISTAL RADIUS FRACTURE
Anesthesia: Regional | Laterality: Right

## 2021-10-21 MED ORDER — MEPERIDINE HCL 25 MG/ML IJ SOLN: 6.2500 mg | INTRAMUSCULAR | Status: DC | PRN

## 2021-10-21 MED ORDER — PROPOFOL 10 MG/ML IV BOLUS
INTRAVENOUS | Status: AC
  Filled 2021-10-21: qty 20

## 2021-10-21 MED ORDER — PROPOFOL 500 MG/50ML IV EMUL
INTRAVENOUS | Status: DC | PRN
  Administered 2021-10-21: 150 ug/kg/min via INTRAVENOUS

## 2021-10-21 MED ORDER — ONDANSETRON HCL 4 MG/2ML IJ SOLN
INTRAMUSCULAR | Status: AC
  Filled 2021-10-21: qty 2

## 2021-10-21 MED ORDER — LACTATED RINGERS IV SOLN: INTRAVENOUS | Status: DC

## 2021-10-21 MED ORDER — ONDANSETRON HCL 4 MG/2ML IJ SOLN
INTRAMUSCULAR | Status: DC | PRN
  Administered 2021-10-21: 4 mg via INTRAVENOUS

## 2021-10-21 MED ORDER — CHLORHEXIDINE GLUCONATE 0.12 % MT SOLN
15.0000 mL | Freq: Once | OROMUCOSAL | Status: AC
  Administered 2021-10-21: 15 mL via OROMUCOSAL
  Filled 2021-10-21: qty 15

## 2021-10-21 MED ORDER — ONDANSETRON HCL 4 MG/2ML IJ SOLN
4.0000 mg | Freq: Once | INTRAMUSCULAR | Status: AC | PRN
  Administered 2021-10-21: 4 mg via INTRAVENOUS

## 2021-10-21 MED ORDER — ONDANSETRON HCL 4 MG/2ML IJ SOLN
INTRAMUSCULAR | Status: AC
Start: 2021-10-21 — End: ?
  Filled 2021-10-21: qty 2

## 2021-10-21 MED ORDER — CEFAZOLIN SODIUM-DEXTROSE 2-4 GM/100ML-% IV SOLN
2.0000 g | INTRAVENOUS | Status: AC
  Administered 2021-10-21: 2 g via INTRAVENOUS
  Filled 2021-10-21: qty 100

## 2021-10-21 MED ORDER — MIDAZOLAM HCL 2 MG/2ML IJ SOLN
INTRAMUSCULAR | Status: AC
  Filled 2021-10-21: qty 2

## 2021-10-21 MED ORDER — HYDROMORPHONE HCL 1 MG/ML IJ SOLN: 0.2500 mg | INTRAMUSCULAR | Status: DC | PRN

## 2021-10-21 MED ORDER — PHENYLEPHRINE HCL (PRESSORS) 10 MG/ML IV SOLN
INTRAVENOUS | Status: AC
Start: 2021-10-21 — End: ?
  Filled 2021-10-21: qty 1

## 2021-10-21 MED ORDER — FENTANYL CITRATE (PF) 100 MCG/2ML IJ SOLN: 100.0000 ug | Freq: Once | INTRAMUSCULAR | Status: AC

## 2021-10-21 MED ORDER — OXYCODONE HCL 5 MG PO TABS: 5.0000 mg | ORAL_TABLET | ORAL | 0 refills | Status: AC | PRN

## 2021-10-21 MED ORDER — ACETAMINOPHEN 500 MG PO TABS
1000.0000 mg | ORAL_TABLET | ORAL | Status: AC
Start: 2021-10-21 — End: 2021-10-21
  Filled 2021-10-21: qty 2

## 2021-10-21 MED ORDER — ACETAMINOPHEN 160 MG/5ML PO SOLN: 325.0000 mg | ORAL | Status: DC | PRN

## 2021-10-21 MED ORDER — FENTANYL CITRATE (PF) 100 MCG/2ML IJ SOLN
INTRAMUSCULAR | Status: AC
  Administered 2021-10-21: 100 ug via INTRAVENOUS
  Filled 2021-10-21: qty 2

## 2021-10-21 MED ORDER — FENTANYL CITRATE (PF) 250 MCG/5ML IJ SOLN
INTRAMUSCULAR | Status: AC
  Filled 2021-10-21: qty 5

## 2021-10-21 MED ORDER — MIDAZOLAM HCL 2 MG/2ML IJ SOLN
INTRAMUSCULAR | Status: AC
  Administered 2021-10-21: 2 mg via INTRAVENOUS
  Filled 2021-10-21: qty 2

## 2021-10-21 MED ORDER — PROPOFOL 10 MG/ML IV BOLUS
INTRAVENOUS | Status: DC | PRN
  Administered 2021-10-21: 200 mg via INTRAVENOUS
  Administered 2021-10-21: 40 mg via INTRAVENOUS

## 2021-10-21 MED ORDER — PHENYLEPHRINE HCL (PRESSORS) 10 MG/ML IV SOLN
INTRAVENOUS | Status: AC
  Filled 2021-10-21: qty 1

## 2021-10-21 MED ORDER — PHENYLEPHRINE HCL (PRESSORS) 10 MG/ML IV SOLN
INTRAVENOUS | Status: DC | PRN
  Administered 2021-10-21 (×2): 40 ug via INTRAVENOUS

## 2021-10-21 MED ORDER — ACETAMINOPHEN 500 MG PO TABS
ORAL_TABLET | ORAL | Status: AC
  Administered 2021-10-21: 1000 mg via ORAL
  Filled 2021-10-21: qty 2

## 2021-10-21 MED ORDER — LIDOCAINE 2% (20 MG/ML) 5 ML SYRINGE
INTRAMUSCULAR | Status: DC | PRN
  Administered 2021-10-21: 100 mg via INTRAVENOUS

## 2021-10-21 MED ORDER — OXYCODONE HCL 5 MG PO TABS: 5.0000 mg | ORAL_TABLET | Freq: Once | ORAL | Status: DC | PRN

## 2021-10-21 MED ORDER — ORAL CARE MOUTH RINSE: 15.0000 mL | Freq: Once | OROMUCOSAL | Status: AC

## 2021-10-21 MED ORDER — 0.9 % SODIUM CHLORIDE (POUR BTL) OPTIME
TOPICAL | Status: DC | PRN
  Administered 2021-10-21: 1000 mL

## 2021-10-21 MED ORDER — DEXMEDETOMIDINE (PRECEDEX) IN NS 20 MCG/5ML (4 MCG/ML) IV SYRINGE
PREFILLED_SYRINGE | INTRAVENOUS | Status: DC | PRN
  Administered 2021-10-21: 8 ug via INTRAVENOUS

## 2021-10-21 MED ORDER — FENTANYL CITRATE (PF) 100 MCG/2ML IJ SOLN: 25.0000 ug | INTRAMUSCULAR | Status: DC | PRN

## 2021-10-21 MED ORDER — OXYCODONE HCL 5 MG/5ML PO SOLN: 5.0000 mg | Freq: Once | ORAL | Status: DC | PRN

## 2021-10-21 MED ORDER — ACETAMINOPHEN 325 MG PO TABS: 325.0000 mg | ORAL_TABLET | ORAL | Status: DC | PRN

## 2021-10-21 MED ORDER — MIDAZOLAM HCL 2 MG/2ML IJ SOLN
2.0000 mg | Freq: Once | INTRAMUSCULAR | Status: AC
Start: 2021-10-21 — End: 2021-10-21

## 2021-10-21 MED ORDER — PHENYLEPHRINE HCL-NACL 20-0.9 MG/250ML-% IV SOLN
INTRAVENOUS | Status: DC | PRN
  Administered 2021-10-21: 25 ug/min via INTRAVENOUS

## 2021-10-21 MED ORDER — BUPIVACAINE-EPINEPHRINE (PF) 0.5% -1:200000 IJ SOLN
INTRAMUSCULAR | Status: DC | PRN
  Administered 2021-10-21: 30 mL via PERINEURAL

## 2021-10-21 SURGICAL SUPPLY — 75 items
BAG COUNTER SPONGE SURGICOUNT (BAG) ×2 IMPLANT
BIT DRILL DIS RAD GEMNS 2.3X40 (DRILL) IMPLANT
BIT DRILL SOLID 2.0X40MM (BIT) IMPLANT
BIT DRILL SOLID 2.5X40MM (BIT) IMPLANT
BLADE CLIPPER SURG (BLADE) ×1 IMPLANT
BNDG ELASTIC 3X5.8 VLCR STR LF (GAUZE/BANDAGES/DRESSINGS) ×2 IMPLANT
BNDG ELASTIC 4X5.8 VLCR STR LF (GAUZE/BANDAGES/DRESSINGS) ×1 IMPLANT
BNDG ESMARK 4X9 LF (GAUZE/BANDAGES/DRESSINGS) ×2 IMPLANT
BNDG GAUZE DERMACEA FLUFF (GAUZE/BANDAGES/DRESSINGS) ×2
BNDG GAUZE DERMACEA FLUFF 4 (GAUZE/BANDAGES/DRESSINGS) IMPLANT
BNDG GAUZE ELAST 4 BULKY (GAUZE/BANDAGES/DRESSINGS) ×1 IMPLANT
CANISTER SUCT 3000ML PPV (MISCELLANEOUS) ×2 IMPLANT
CORD BIPOLAR FORCEPS 12FT (ELECTRODE) ×2 IMPLANT
COVER SURGICAL LIGHT HANDLE (MISCELLANEOUS) ×2 IMPLANT
CUFF TOURN SGL QUICK 18X4 (TOURNIQUET CUFF) ×2 IMPLANT
DRAPE OEC MINIVIEW 54X84 (DRAPES) ×2 IMPLANT
DRAPE SURG 17X11 SM STRL (DRAPES) ×1 IMPLANT
DRAPE U-SHAPE 48X52 POLY STRL (PACKS) ×1 IMPLANT
DRILL DIS RAD GEMINUS 2.3X40 (DRILL) ×2
DRILL SOLID 2.0X40MM (BIT) ×2
DRILL SOLID 2.5X40MM (BIT) ×2
DRSG ADAPTIC 3X8 NADH LF (GAUZE/BANDAGES/DRESSINGS) ×1 IMPLANT
GAUZE 4X4 16PLY ~~LOC~~+RFID DBL (SPONGE) ×3 IMPLANT
GAUZE SPONGE 4X4 12PLY STRL (GAUZE/BANDAGES/DRESSINGS) ×2 IMPLANT
GAUZE XEROFORM 5X9 LF (GAUZE/BANDAGES/DRESSINGS) ×2 IMPLANT
GLOVE BIOGEL PI IND STRL 7.0 (GLOVE) IMPLANT
GLOVE BIOGEL PI IND STRL 8.5 (GLOVE) ×1 IMPLANT
GLOVE BIOGEL PI INDICATOR 7.0 (GLOVE) ×1
GLOVE BIOGEL PI INDICATOR 8.5 (GLOVE) ×1
GLOVE SURG ORTHO 8.0 STRL STRW (GLOVE) ×2 IMPLANT
GLOVE SURG SS PI 7.0 STRL IVOR (GLOVE) ×2 IMPLANT
GOWN STRL REUS W/ TWL LRG LVL3 (GOWN DISPOSABLE) ×1 IMPLANT
GOWN STRL REUS W/ TWL XL LVL3 (GOWN DISPOSABLE) ×1 IMPLANT
GOWN STRL REUS W/TWL LRG LVL3 (GOWN DISPOSABLE) ×2
GOWN STRL REUS W/TWL XL LVL3 (GOWN DISPOSABLE) ×2
GUIDE AIMING 1.5MM (WIRE) ×2 IMPLANT
KIT BASIN OR (CUSTOM PROCEDURE TRAY) ×2 IMPLANT
KIT TURNOVER KIT B (KITS) ×2 IMPLANT
NDL HYPO 25X1 1.5 SAFETY (NEEDLE) ×1 IMPLANT
NEEDLE HYPO 25X1 1.5 SAFETY (NEEDLE) IMPLANT
NS IRRIG 1000ML POUR BTL (IV SOLUTION) ×2 IMPLANT
PACK ORTHO EXTREMITY (CUSTOM PROCEDURE TRAY) ×2 IMPLANT
PAD ARMBOARD 7.5X6 YLW CONV (MISCELLANEOUS) ×4 IMPLANT
PAD CAST 4YDX4 CTTN HI CHSV (CAST SUPPLIES) ×1 IMPLANT
PADDING CAST COTTON 4X4 STRL (CAST SUPPLIES) ×2
PEG GEMINUS SMOOTH LOCK 2.0X17 (Peg) ×2 IMPLANT
PEG GEMINUS THRD TPNL 2.7X18 (Peg) ×1 IMPLANT
PEG SMOOTH LOCK 2.0X16 (Screw) ×1 IMPLANT
PEG SMOOTH LOCK 2.0X18 (Screw) ×2 IMPLANT
PEG THREADED LOCK 2.3X18 (Screw) ×1 IMPLANT
PLATE DISTAL RADIUS 4H (Plate) ×1 IMPLANT
PLATE DORSAL SHT SPAN GEM 210 (Plate) ×1 IMPLANT
SCREW COMP MTHRD VOLAR 3X8 (Screw) ×1 IMPLANT
SCREW COMP MULTI THRD 3.0X10 (Screw) ×1 IMPLANT
SCREW COMP MULTI THRD 3.0X12 (Screw) ×3 IMPLANT
SCREW LOCK MULTI THRD 3X8 (Screw) ×2 IMPLANT
SCREW POLY NON LOCK 3.5MMX12MM (Screw) ×2 IMPLANT
SCREW POLY NON LOCK 3.5MMX14MM (Screw) ×1 IMPLANT
SCREWDRIVER SURG ST 2 (INSTRUMENTS) ×2 IMPLANT
SOAP 2 % CHG 4 OZ (WOUND CARE) ×2 IMPLANT
SPLINT FIBERGLASS 4X30 (CAST SUPPLIES) ×1 IMPLANT
SPONGE T-LAP 18X18 ~~LOC~~+RFID (SPONGE) ×1 IMPLANT
SUT ETHILON 4 0 PS 2 18 (SUTURE) ×3 IMPLANT
SUT MNCRL AB 3-0 PS2 27 (SUTURE) ×1 IMPLANT
SUT PROLENE 4 0 PS 2 18 (SUTURE) IMPLANT
SUT VIC AB 2-0 CT1 27 (SUTURE)
SUT VIC AB 2-0 CT1 TAPERPNT 27 (SUTURE) IMPLANT
SUT VICRYL 4-0 PS2 18IN ABS (SUTURE) IMPLANT
TAPE UMBILICAL 1/8 X36 TWILL (MISCELLANEOUS) ×1 IMPLANT
TOWEL GREEN STERILE (TOWEL DISPOSABLE) ×2 IMPLANT
TOWEL GREEN STERILE FF (TOWEL DISPOSABLE) IMPLANT
TRAP DIGIT (INSTRUMENTS) ×1 IMPLANT
TUBE CONNECTING 12X1/4 (SUCTIONS) ×2 IMPLANT
WIRE FIX 1.5 STANDARD TIP (WIRE) ×4
WIRE FIX 1.5 STD TIP (WIRE) IMPLANT

## 2021-10-21 NOTE — Transfer of Care (Signed)
Immediate Anesthesia Transfer of Care Note  Patient: Richard Conrad  Procedure(s) Performed: RIGHT OPEN REDUCTION INTERNAL FIXATION (ORIF) DISTAL RADIAL FRACTURE (Right)  Patient Location: PACU  Anesthesia Type:GA combined with regional for post-op pain  Level of Consciousness: awake, alert  and oriented  Airway & Oxygen Therapy: Patient Spontanous Breathing  Post-op Assessment: Report given to RN and Post -op Vital signs reviewed and stable  Post vital signs: Reviewed and stable  Last Vitals:  Vitals Value Taken Time  BP 163/82 10/21/21 2103  Temp    Pulse 89 10/21/21 2104  Resp 20 10/21/21 2104  SpO2 99 % 10/21/21 2104  Vitals shown include unvalidated device data.  Last Pain:  Vitals:   10/21/21 1418  TempSrc: Oral         Complications: No notable events documented.

## 2021-10-21 NOTE — Brief Op Note (Signed)
10/21/2021  9:00 PM  PATIENT:  Richard Conrad  61 y.o. male  PRE-OPERATIVE DIAGNOSIS:  RIGHT DISTAL RADIUS FRACTURE  POST-OPERATIVE DIAGNOSIS:  RIGHT DISTAL RADIUS FRACTURE  PROCEDURE:  Procedure(s): RIGHT OPEN REDUCTION INTERNAL FIXATION (ORIF) DISTAL RADIAL FRACTURE (Right)  SURGEON:  Surgeon(s) and Role:    * Sherilyn Cooter, MD - Primary  PHYSICIAN ASSISTANT:   ASSISTANTS: none   ANESTHESIA:   regional and general  EBL:  50 mL   BLOOD ADMINISTERED:none  DRAINS: none   LOCAL MEDICATIONS USED:  NONE  SPECIMEN:  No Specimen  DISPOSITION OF SPECIMEN:  N/A  COUNTS:  YES  TOURNIQUET:   Total Tourniquet Time Documented: Upper Arm (Right) - 127 minutes Total: Upper Arm (Right) - 127 minutes   DICTATION: .Viviann Spare Dictation  PLAN OF CARE: Discharge to home after PACU  PATIENT DISPOSITION:  PACU - hemodynamically stable.   Delay start of Pharmacological VTE agent (>24hrs) due to surgical blood loss or risk of bleeding: not applicable

## 2021-10-21 NOTE — Anesthesia Procedure Notes (Signed)
Anesthesia Regional Block: Supraclavicular block   Pre-Anesthetic Checklist: , timeout performed,  Correct Patient, Correct Site, Correct Laterality,  Correct Procedure, Correct Position, site marked,  Risks and benefits discussed,  Pre-op evaluation,  At surgeon's request and post-op pain management  Laterality: Right  Prep: Maximum Sterile Barrier Precautions used, chloraprep       Needles:  Injection technique: Single-shot  Needle Type: Echogenic Stimulator Needle     Needle Length: 9cm  Needle Gauge: 21     Additional Needles:   Procedures:,,,, ultrasound used (permanent image in chart),,    Narrative:  Start time: 10/21/2021 4:25 PM End time: 10/21/2021 4:35 PM Injection made incrementally with aspirations every 5 mL.  Performed by: Personally  Anesthesiologist: Roderic Palau, MD

## 2021-10-21 NOTE — Anesthesia Preprocedure Evaluation (Signed)
Anesthesia Evaluation  Patient identified by MRN, date of birth, ID band Patient awake    Reviewed: Allergy & Precautions, H&P , NPO status , Patient's Chart, lab work & pertinent test results  Airway Mallampati: II  TM Distance: >3 FB Neck ROM: Full    Dental no notable dental hx. (+) Teeth Intact, Dental Advisory Given   Pulmonary neg pulmonary ROS,    Pulmonary exam normal breath sounds clear to auscultation       Cardiovascular hypertension, Pt. on medications  Rhythm:Regular Rate:Normal     Neuro/Psych negative neurological ROS  negative psych ROS   GI/Hepatic Neg liver ROS, GERD  ,  Endo/Other  Hypothyroidism   Renal/GU negative Renal ROS  negative genitourinary   Musculoskeletal   Abdominal   Peds  Hematology  (+) Blood dyscrasia, anemia ,   Anesthesia Other Findings   Reproductive/Obstetrics negative OB ROS                             Anesthesia Physical Anesthesia Plan  ASA: 2  Anesthesia Plan: MAC   Post-op Pain Management: Regional block*, Tylenol PO (pre-op)* and Minimal or no pain anticipated   Induction: Intravenous  PONV Risk Score and Plan: 1 and Propofol infusion, Midazolam and Ondansetron  Airway Management Planned: Natural Airway and Simple Face Mask  Additional Equipment:   Intra-op Plan:   Post-operative Plan:   Informed Consent: I have reviewed the patients History and Physical, chart, labs and discussed the procedure including the risks, benefits and alternatives for the proposed anesthesia with the patient or authorized representative who has indicated his/her understanding and acceptance.     Dental advisory given  Plan Discussed with: CRNA  Anesthesia Plan Comments:         Anesthesia Quick Evaluation

## 2021-10-21 NOTE — Anesthesia Postprocedure Evaluation (Signed)
Anesthesia Post Note  Patient: SAMIL MECHAM  Procedure(s) Performed: RIGHT OPEN REDUCTION INTERNAL FIXATION (ORIF) DISTAL RADIAL FRACTURE (Right)     Patient location during evaluation: PACU Anesthesia Type: General and Regional Level of consciousness: awake and alert Pain management: pain level controlled Vital Signs Assessment: post-procedure vital signs reviewed and stable Respiratory status: spontaneous breathing, nonlabored ventilation, respiratory function stable and patient connected to nasal cannula oxygen Cardiovascular status: blood pressure returned to baseline and stable Postop Assessment: no apparent nausea or vomiting Anesthetic complications: no   No notable events documented.  Last Vitals:  Vitals:   10/21/21 2215 10/21/21 2220  BP: (!) 176/85 (!) 166/80  Pulse: 87 86  Resp:  16  Temp:    SpO2: 99% 98%    Last Pain:  Vitals:   10/21/21 2120  TempSrc:   PainSc: 0-No pain                 Tamana Hatfield

## 2021-10-21 NOTE — Interval H&P Note (Signed)
History and Physical Interval Note:  10/21/2021 4:56 PM  Richard Conrad  has presented today for surgery, with the diagnosis of RIGHT DISTAL RADIUS FRACTURE.  The various methods of treatment have been discussed with the patient and family. After consideration of risks, benefits and other options for treatment, the patient has consented to  Procedure(s): RIGHT OPEN REDUCTION INTERNAL FIXATION (ORIF) DISTAL RADIAL FRACTURE (Right) as a surgical intervention.  The patient's history has been reviewed, patient examined, no change in status, stable for surgery.  I have reviewed the patient's chart and labs.  Questions were answered to the patient's satisfaction.     [name]

## 2021-10-21 NOTE — Discharge Instructions (Signed)
Audria Nine, M.D. Hand Surgery  POST-OPERATIVE DISCHARGE INSTRUCTIONS   PRESCRIPTIONS: You may have been given a prescription to be taken as directed for post-operative pain control.  You may also take over the counter ibuprofen/aleve and tylenol for pain. Take this as directed on the packaging. Do not exceed 3000 mg tylenol/acetaminophen in 24 hours.  Ibuprofen 600-800 mg (3-4) tablets by mouth every 6 hours as needed for pain.  OR Aleve 2 tablets by mouth every 12 hours (twice daily) as needed for pain.  AND/OR Tylenol 1000 mg (2 tablets) every 8 hours as needed for pain.  Please use your pain medication carefully, as refills are limited and you may not be provided with one.  As stated above, please use over the counter pain medicine - it will also be helpful with decreasing your swelling.    ANESTHESIA: After your surgery, post-surgical discomfort or pain is likely. This discomfort can last several days to a few weeks. At certain times of the day your discomfort may be more intense.   Did you receive a nerve block?  A nerve block can provide pain relief for one hour to two days after your surgery. As long as the nerve block is working, you will experience little or no sensation in the area the surgeon operated on.  As the nerve block wears off, you will begin to experience pain or discomfort. It is very important that you begin taking your prescribed pain medication before the nerve block fully wears off. Treating your pain at the first sign of the block wearing off will ensure your pain is better controlled and more tolerable when full-sensation returns. Do not wait until the pain is intolerable, as the medicine will be less effective. It is better to treat pain in advance than to try and catch up.   General Anesthesia:  If you did not receive a nerve block during your surgery, you will need to start taking your pain medication shortly after your surgery and should continue  to do so as prescribed by your surgeon.     ICE AND ELEVATION: You may use ice for the first 48-72 hours, but it is not critical.   Motion of your fingers is very important to decrease the swelling.  Elevation, as much as possible for the next 48 hours, is critical for decreasing swelling as well as for pain relief. Elevation means when you are seated or lying down, you hand should be at or above your heart. When walking, the hand needs to be at or above the level of your elbow.  If the bandage gets too tight, it may need to be loosened. Please contact our office and we will instruct you in how to do this.    SURGICAL BANDAGES:  Keep your dressing and/or splint clean and dry at all times.  Do not remove until you are seen again in the office.  If careful, you may place a plastic bag over your bandage and tape the end to shower, but be careful, do not get your bandages wet.     HAND THERAPY:  You may not need any. If you do, we will begin this at your follow up visit in the clinic.    ACTIVITY AND WORK: You are encouraged to move any fingers which are not in the bandage.  Light use of the fingers is allowed to assist the other hand with daily hygiene and eating, but strong gripping or lifting is often uncomfortable and  should be avoided.  You might miss a variable period of time from work and hopefully this issue has been discussed prior to surgery. You may not do any heavy work with your affected hand for about 2 weeks.    [facility] [address] [place],  [place]  [address] [phone]  

## 2021-10-22 ENCOUNTER — Telehealth: Payer: Self-pay | Admitting: Orthopedic Surgery

## 2021-10-22 ENCOUNTER — Encounter (HOSPITAL_COMMUNITY): Payer: Self-pay | Admitting: Orthopedic Surgery

## 2021-10-22 NOTE — Op Note (Signed)
Date of Surgery: 10/22/2021  INDICATIONS: Patient is a 61 y.o.-year-old male with a severely comminuted right distal radius fracture after falling from the back of a truck while moving furniture approximately 10 days ago.  Risks, benefits, and alternatives to surgery were again discussed with the patient in the preoperative area. The patient wishes to proceed with surgery.  Informed consent was signed after our discussion.   PREOPERATIVE DIAGNOSIS:  Right distal radius fracture Right ulnar styloid tip fracture  POSTOPERATIVE DIAGNOSIS: Same.  PROCEDURE:  ORIF right distal radius fracture, intra-articular with 3+ fragments   SURGEON: Waylan Rocher, M.D.  ASSIST:   ANESTHESIA:  Regional, general  IV FLUIDS AND URINE: See anesthesia.  ESTIMATED BLOOD LOSS: 20 mL.  IMPLANTS:  Implant Name Type Inv. Item Serial No. Manufacturer Lot No. LRB No. Used Action  PEG GEMINUS THRD TPNL 2.7X18 - [id number] Peg PEG GEMINUS THRD TPNL 2.7X18  SKELET[employer]YNAM[employer]  Right 1 Implanted  SCREW POLY NON LOCK 3.5CLU86NV - [id number] Screw SCREW POLY NON LOCK 3.0TQZ25WN  SKELETAL [employer]YNAM[employer]  Right 2 Implanted  PEG GEMINUS SMOOTH LOCK 2.0X17 - [id number] Peg PEG GEMINUS SMOOTH LOCK 2.0X17  SKELETAL DYNAMICS  Right 2 Implanted  Geminus Volar Distal Radius Plate    SKELETAL DYNAMICS  Right 1 Implanted  PEG SMOOTH LOCK 2.0X18 - [id number] Screw PEG SMOOTH LOCK 2.0X18  SKELETAL [employer]YNAM[employer]  Right 2 Implanted  PEG SMOOTH LOCK 2.0X16 - [id number] Screw PEG SMOOTH LOCK 2.0X16  SKELETAL [employer]YNAM[employer]  Right 1 Implanted  PEG THREADED LOCK 2.3X18 - [id number] Screw PEG THREADED LOCK 2.3X18  SKELETAL [employer]YNAM[employer]  Right 1 Implanted and Explanted  SCREW POLY NON LOCK 3.6BMJ74LE - [id number] Screw SCREW POLY NON LOCK 3.7FLO15TZ  SKELETAL DYNAMICS  Right 1 Implanted  DORSAL SPANNING PLATE    SKELETAL DYNAMICS  Right 1 Implanted  SCREW LOCK MULTI THRD 3X8 - [id number] Screw SCREW LOCK MULTI THRD 3X8  SKELETAL [employer]YNAM[employer]  Right 2 Implanted   3.0 NON LOCKING SCREW    SKELETAL DYNAMICS  Right 1 Implanted  SCREW COMP MULTI THRD 3.0X12 - [id number] Screw SCREW COMP MULTI THRD 3.0X12  SKELETAL DYNAMICS  Right 3 Implanted  SCREW COMP MULTI THRD 3.0X10 - [id number] Screw SCREW COMP MULTI THRD 3.0X10  SKELETAL DYNAMICS  Right 1 Implanted     DRAINS: None  COMPLICATIONS: None  DESCRIPTION OF PROCEDURE: The patient was met in the preoperative holding area where the surgical site was marked and the consent form was verified.  The patient was then taken to the operating room and transferred to the operating table.  All bony prominences were well padded.  A tourniquet was applied to the right upper arm.  Patient was still having pain in the wrist despite the peripheral nerve block so  general endotracheal anesthesia was induced.  The operative extremity was prepped and draped in the usual and sterile fashion.  A formal time-out was performed to confirm that this was the correct patient, surgery, side, and site.   Following timeout, the limb was exsanguinated with an Esmarch bandage and the tourniquet inflated to 50 mmHg.  A FCR approach was designed on the volar aspect of the wrist.  The skin was incised.  Blunt dissection was used to identify the FCR tendon sheath.  Small crossing vessels were coagulated with bipolar cautery.  The superficial branch of the radial artery was identified deep in the distal portion of the wound and was protected.  The superficial FCR sheath was incised completely.  The FCR  tendon was retracted in a radial direction to protect the radial artery and the floor of the FCR tendon sheath was incised.  Blunt dissection was used to develop the space of Parona between the FPL and pronator quadratus.  There was significant disruption of the pronator quadratus and the radial soft tissues.  Blunt dissection was used to identify the brachioradialis tendon which was identified.  The first dorsal compartment tendons were identified and  protected.  The brachioradialis was released.  There was significant comminution of the radial styloid fragment.  There was a large fragment from the volar ulnar aspect of the distal radius as well as a free butterfly fragment from the ulnar metadiaphysis.  Incision was made along the distal aspect of the radius along the radial aspect of the radius and the pronator quadratus was subperiosteally dissected off of the radius.  There was a very small and comminuted articular volar radial lip fragment that was very displaced.  There is also a dye punch type fracture with comminution of the central portion of the articular surface.  The large volar ulnar fragment was able to be reduced anatomically to the ulnar metaphysis.  The smaller volar radial fragment was manipulated using a pickup and a dental pick to try to align the articular surface as best as possible.  This fragment was essentially free with only a few soft tissue attachments remaining.  A 4-hole standard right Geminus plate was selected and placed on the volar aspect of the radius.  Appropriate proximal and distal position was determined and a screw was placed in the oblong hole of the plate.  A K wire was placed in the aiming guide of the ulnar head of the plate.  This secured the ulnar fragment.  The volar radial fragment and the comminuted styloid fragments were still very unstable.  The ulnar head of the plate was filled with smooth locking pegs.  The volar radial fragment was reduced in acceptable position.  The comminuted styloid fragments were reduced with ulnar deviation of the wrist and direct pressure over the styloid fragments.  The K wire and the radial styloid aiming guide of the plate was placed.  The joint surface was acceptably congruent on the AP views with a small step-off from the central dye punch fragment and from this unstable volar radial fragment.  The fracture was reduced to the plate as best possible and smooth locking pegs were  placed in the radial head of the plate.  An AP view showed that the radial height was restored.  The radial inclination appear to be restored.  This was previously placed K wire and the radial styloid aiming guide was replaced with a locking threaded screw to try again as much versus possible in this very comminuted styloid fragment.  The lateral view showed seemingly congruent reduction of the articular surface with neutral to slight volar tilt.  With range of motion of the wrist the fragments seems somewhat unstable and I was concerned about postoperative stability.  I therefore decided back up with the volar plate fixation with a dorsal bridge plate.  The plate was placed in the dorsal aspect of the forearm from the index metacarpal to the radial shaft.  Incisions were made directly over the index metacarpal and the radial shaft.  Proximally, the skin was incised and blunt dissection was used to identify the radial wrist extensors and the third dorsal compartment tendons.  The interval was developed to gain access to the radius.  The  skin over the metacarpal was incised and dissection was used to identify the index metacarpal.  The plate was passed in a distal to proximal direction taking care that the plate was underneath the EPL tendon.  A small incision was made directly over the EPL tendon to confirm that the plate was underneath this and there is no tendon entrapment.  The plate was secured in place on the metacarpal and the radius using lobster-claw's.  Appropriate plate position was confirmed on C arm.  The plate was first fixed distally on the metacarpal.  It was then fixed proximally in the radius.  X-rays in both AP and lateral view showed appropriate screw position and appropriate screw length.  At this point, I was happy with the fracture fixation.  The DRUJ was tested in neutral, pronation, and supination and found to be stable.  The dorsal wounds were thoroughly irrigate with copious sterile saline.   The tourniquet was let down.  Hemostasis was achieved with direct pressure over the wound and bipolar cautery.  The dorsal wounds were closed using a 4-0 nylon suture in horizontal mattress fashion.  Then returned to the volar aspect of the wound.  The wound had been provisionally closed with nylon sutures while the dorsal spanning plate was applied.  The sutures were removed.  The wound was inspected and hemostasis was achieved using bipolar cautery.  The wound on the volar aspect was thoroughly irrigated.  The wound was closed using combination of 3-0 Monocryl sutures in a buried interrupted fashion followed by 4-0 horizontal mattress sutures.  The wounds were dressed with Xeroform, folded Kerlix, cast padding, and a well-padded volar splint was applied.  Patient was reversed of anesthesia and extubated uneventfully.  Patient was transferred the postoperative bed.  All counts were correct at the end the procedure.  The patient was then taken to PACU in stable condition.   POSTOPERATIVE PLAN: He will be discharged home with appropriate pain medication and discharge instructions.  I will see him back in the office in 10 to 14 days for his first postop visit.  Audria Nine, MD 10:43 AM

## 2021-10-22 NOTE — Telephone Encounter (Signed)
Pt called and states that the pain medicine is not working. He states its not touching his pain. E is wondering  if 800 mg ibuprofen would work better? He states he has not been able to get any relief   Cb [phone]

## 2021-10-29 ENCOUNTER — Ambulatory Visit (INDEPENDENT_AMBULATORY_CARE_PROVIDER_SITE_OTHER): Payer: Self-pay | Admitting: Orthopedic Surgery

## 2021-10-29 ENCOUNTER — Ambulatory Visit (INDEPENDENT_AMBULATORY_CARE_PROVIDER_SITE_OTHER): Payer: Self-pay

## 2021-10-29 DIAGNOSIS — S52571A Other intraarticular fracture of lower end of right radius, initial encounter for closed fracture: Secondary | ICD-10-CM

## 2021-10-29 NOTE — Progress Notes (Signed)
   Post-Op Visit Note   Patient: Richard Conrad           Date of Birth: December 28, 1960           MRN: [id number] Visit Date: 10/29/2021 PCP: Vivi Barrack, MD   Assessment & Plan:  Chief Complaint:  Chief Complaint  Patient presents with   Right Wrist - Routine Post Op   Visit Diagnoses:  1. Other closed intra-articular fracture of distal end of right radius, initial encounter     Plan: Patient is 8 days s/p ORIF of his right distal radius fracture.  His fracture was quite comminuted with a volar radial fragment that was quite unstable and not well fixed by the volar plate so his fracture was bridge with a dorsal spanning plate.  He is doing well.  His incisions are clean, dry, and well approximated.  The sutures were removed and steri strips placed.  He will be placed back into a volar splint.  He is getting married in [place] next week and will not be back for two weeks.  He will see therapy as soon as he gets back to start the rehab process.  Follow-Up Instructions: No follow-ups on file.   Orders:  Orders Placed This Encounter  Procedures   XR Wrist Complete Right   No orders of the defined types were placed in this encounter.   Imaging: No results found.  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" (03/23/2016)   History of kidney stones    "passed them"   Hypertension    Hypothyroidism    Iron deficiency anemia    Seasonal allergies    "fall and spring; mild"  (03/23/2016)    Family History  Problem Relation Age of Onset   Cancer Mother    Hyperlipidemia Mother    Cancer Father    Hyperlipidemia Father     Past Surgical History:  Procedure Laterality Date   APPENDECTOMY  03/23/2016   COLON RESECTION N/A 03/23/2016   Procedure: LAPAROSCOPIC CECECTOMY;  Surgeon: Coralie Keens, MD;  Location: [facility];  Service: General;  Laterality: N/A;   COLON SURGERY  03/23/2016   LAPAROSCOPIC CECECTOMY     COLONOSCOPY     ESOPHAGOGASTRODUODENOSCOPY ENDOSCOPY     OPEN REDUCTION INTERNAL FIXATION (ORIF) DISTAL RADIAL FRACTURE Right 10/21/2021   Procedure: RIGHT OPEN REDUCTION INTERNAL FIXATION (ORIF) DISTAL RADIAL FRACTURE;  Surgeon: Sherilyn Cooter, MD;  Location: [facility];  Service: Orthopedics;  Laterality: Right;   RADIAL ARTERY ANEURYSM REPAIR  2019   TONSILLECTOMY     VASECTOMY     Social History   Occupational History   Not on file  Tobacco Use   Smoking status: Never   Smokeless tobacco: Never  Vaping Use   Vaping Use: Never used  Substance and Sexual Activity   Alcohol use: No    Alcohol/week: 0.0 standard drinks of alcohol   Drug use: No   Sexual activity: Yes

## 2021-10-30 NOTE — Telephone Encounter (Signed)
That is fine 

## 2021-10-30 NOTE — Telephone Encounter (Signed)
Patient called in stating he will be back in town on 07/12 that evening and he would be able to start physical therapy after that. Please advise

## 2021-11-06 ENCOUNTER — Telehealth: Payer: Self-pay | Admitting: Orthopedic Surgery

## 2021-11-06 NOTE — Telephone Encounter (Signed)
Pt is calling regarding Physical Therapy --please call him

## 2021-11-09 ENCOUNTER — Other Ambulatory Visit: Payer: Self-pay | Admitting: Radiology

## 2021-11-09 DIAGNOSIS — S52571A Other intraarticular fracture of lower end of right radius, initial encounter for closed fracture: Secondary | ICD-10-CM

## 2021-11-09 NOTE — Telephone Encounter (Signed)
Lmom for patient to call back and leave a detailed message with the front desk as to what he is needing regarding PT

## 2021-11-17 ENCOUNTER — Ambulatory Visit (INDEPENDENT_AMBULATORY_CARE_PROVIDER_SITE_OTHER): Payer: Self-pay | Admitting: Orthopedic Surgery

## 2021-11-17 ENCOUNTER — Telehealth: Payer: Self-pay | Admitting: Orthopedic Surgery

## 2021-11-17 ENCOUNTER — Ambulatory Visit (INDEPENDENT_AMBULATORY_CARE_PROVIDER_SITE_OTHER): Payer: Self-pay

## 2021-11-17 DIAGNOSIS — S52571A Other intraarticular fracture of lower end of right radius, initial encounter for closed fracture: Secondary | ICD-10-CM

## 2021-11-17 NOTE — Progress Notes (Signed)
Post-Op Visit Note   Patient: Richard Conrad           Date of Birth: 05-May-1960           MRN: [id number] Visit Date: 11/17/2021 PCP: Vivi Barrack, MD   Assessment & Plan:  Chief Complaint:  Chief Complaint  Patient presents with   Right Wrist - Follow-up, Fracture   Visit Diagnoses:  1. Other closed intra-articular fracture of distal end of right radius, initial encounter     Plan: Patient is just under 4 weeks status post ORIF of his intra-articular right distal radius fracture with both volar and dorsal bridge fixation.  Following application of the volar plate, the fracture was still unstable due to the distal nature and degree of comminution.  A bridge plate was applied.  He has been out of town for the last 2-1/2 to 3 weeks for a wedding in [place].  His incisions are all well-healed.  He is able to make a near full fist with some stiffness.  He has near full pronation but lacks quite a bit of supination secondary to stiffness.  Repeat x-rays today show unchanged hardware position with continued maintenance of radial height, inclination, and volar tilt. The hardware is he will be referred to occupational therapy to start the postoperative rehab process.  I can see him back in 2 weeks following his first therapy visit to see how he is doing.  Follow-Up Instructions: No follow-ups on file.   Orders:  Orders Placed This Encounter  Procedures   XR Wrist Complete Right   Ambulatory referral to Occupational Therapy   No orders of the defined types were placed in this encounter.   Imaging: No results found.  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" (03/23/2016)   History of kidney stones    "passed them"   Hypertension    Hypothyroidism    Iron deficiency anemia    Seasonal allergies    "fall and spring; mild" (03/23/2016)    Family History  Problem Relation Age of Onset   Cancer Mother    Hyperlipidemia Mother    Cancer Father    Hyperlipidemia Father     Past Surgical History:  Procedure Laterality Date   APPENDECTOMY  03/23/2016   COLON RESECTION N/A 03/23/2016   Procedure: LAPAROSCOPIC CECECTOMY;  Surgeon: Coralie Keens, MD;  Location: [facility];  Service: General;  Laterality: N/A;   COLON SURGERY  03/23/2016   LAPAROSCOPIC CECECTOMY     COLONOSCOPY     ESOPHAGOGASTRODUODENOSCOPY ENDOSCOPY     OPEN REDUCTION INTERNAL FIXATION (ORIF) DISTAL RADIAL FRACTURE Right 10/21/2021   Procedure: RIGHT OPEN REDUCTION INTERNAL FIXATION (ORIF) DISTAL RADIAL FRACTURE;  Surgeon: Sherilyn Cooter, MD;  Location: [facility];  Service: Orthopedics;  Laterality: Right;   RADIAL ARTERY ANEURYSM REPAIR  2019   TONSILLECTOMY     VASECTOMY     Social History   Occupational History   Not on file  Tobacco Use   Smoking status: Never  Smokeless tobacco: Never  Vaping Use   Vaping Use: Never used  Substance and Sexual Activity   Alcohol use: No    Alcohol/week: 0.0 standard drinks of alcohol   Drug use: No   Sexual activity: Yes

## 2021-11-17 NOTE — Telephone Encounter (Signed)
Richard Conrad has questions regarding physical therapy referrall at [facility]. Please give him a call @ [phone]

## 2021-11-17 NOTE — Telephone Encounter (Signed)
I called -he states that he lives in [place] and [place] is a long way from him--I changed it to [place] which is closer for him.

## 2021-11-20 ENCOUNTER — Encounter: Payer: Self-pay | Admitting: Occupational Therapy

## 2021-11-20 ENCOUNTER — Ambulatory Visit: Payer: Self-pay | Attending: Orthopedic Surgery | Admitting: Occupational Therapy

## 2021-11-20 DIAGNOSIS — M25531 Pain in right wrist: Secondary | ICD-10-CM | POA: Insufficient documentation

## 2021-11-20 DIAGNOSIS — R600 Localized edema: Secondary | ICD-10-CM | POA: Insufficient documentation

## 2021-11-20 DIAGNOSIS — M25631 Stiffness of right wrist, not elsewhere classified: Secondary | ICD-10-CM | POA: Insufficient documentation

## 2021-11-20 DIAGNOSIS — M6281 Muscle weakness (generalized): Secondary | ICD-10-CM | POA: Insufficient documentation

## 2021-11-20 NOTE — Therapy (Unsigned)
OUTPATIENT OCCUPATIONAL THERAPY ORTHO EVALUATION  Patient Name: Richard Conrad MRN: [id number] DOB:05-May-1960, 61 y.o., male Today's Date: 11/20/2021  PCP: Vivi Barrack, MD REFERRING PROVIDER: Sherilyn Cooter, MD   OT End of Session - 11/20/21 1208     Visit Number 1    Number of Visits 13    Date for OT Re-Evaluation 02/12/22    Authorization Type self pay    OT Start Time 0847    OT Stop Time 1048    OT Time Calculation (min) 121 min    Behavior During Therapy [facility] for tasks assessed/performed            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" (03/23/2016)   History of kidney stones    "passed them"   Hypertension    Hypothyroidism    Iron deficiency anemia    Seasonal allergies    "fall and spring; mild" (03/23/2016)   Past Surgical History:  Procedure Laterality Date   APPENDECTOMY  03/23/2016   COLON RESECTION N/A 03/23/2016   Procedure: LAPAROSCOPIC CECECTOMY;  Surgeon: Coralie Keens, MD;  Location: [place];  Service: General;  Laterality: N/A;   COLON SURGERY  03/23/2016   LAPAROSCOPIC CECECTOMY     COLONOSCOPY     ESOPHAGOGASTRODUODENOSCOPY ENDOSCOPY     OPEN REDUCTION INTERNAL FIXATION (ORIF) DISTAL RADIAL FRACTURE Right 10/21/2021   Procedure: RIGHT OPEN REDUCTION INTERNAL FIXATION (ORIF) DISTAL RADIAL FRACTURE;  Surgeon: Sherilyn Cooter, MD;  Location: [place];  Service: Orthopedics;  Laterality: Right;   RADIAL ARTERY ANEURYSM REPAIR  2019   TONSILLECTOMY     VASECTOMY     Patient Active Problem List   Diagnosis Date Noted   Closed fracture of right distal radius 10/13/2021   Dyslipidemia 04/08/2021   Hyperglycemia 04/08/2021   Benign essential HTN 04/11/2020   Hypothyroidism (acquired) 04/11/2020   Gastroesophageal reflux disease without esophagitis 12/06/2017   Seasonal allergic rhinitis 12/06/2017   Colon polyp, hyperplastic  03/23/2016    ONSET DATE: DOS: 10/21/21, DOI: 10/11/21  REFERRING DIAG: J28.786V (ICD-10-CM) - Other closed intra-articular fracture of distal end of right radius, initial encounter   THERAPY DIAG:  Pain in right wrist  Stiffness of right wrist, not elsewhere classified  Localized edema  Muscle weakness (generalized)  Rationale for Evaluation and Treatment Rehabilitation  SUBJECTIVE:   SUBJECTIVE STATEMENT: Pt arrives to OP OT evaluation after R wrist fracture on 10/11/21 when pt reports he fell from his truck. Surgery on 10/21/21. States he has not yet started therapy because he just got back from getting married in [place] and is also working on closing on a new house this week. Reports he has not been having too much pain, managing well w/ OTC Tylenol/Advil, and has been able to lightly help work on a deck, primarily using his LUE. Pt accompanied by: self  PERTINENT HISTORY: comminuted intra-articular R distal radius fracture s/p ORIF 10/21/21 w/ volar distal radius plate as well as a dorsal spanning bridge plate; R ulnar styloid tip fracture  Per CT on 10/14/21: Impacted, displaced and comminuted intra-articular fracture of the distal radius (This fracture demonstrates up to 2 mm of depression of the distal articular surface anteriorly. There is predominately dorsal displacement at the fracture by up to 0.6 cm. There is a large metaphyseal butterfly fragment which is displaced up to 1.3 cm medially. Nondisplaced fracture  extends into the distal radioulnar joint); laterally displaced ulnar styloid fracture; no evidence of carpal bone fracture or dislocation  PRECAUTIONS: Other: No lifting greater than 2-5 lbs; immobilization orthosis on at all times other than for hygiene and exercises  WEIGHT BEARING RESTRICTIONS Yes - NWB RUE  PAIN:  Are you having pain? Yes: NPRS scale: 2/10 Pain location: along radial border of thumb; some numbness across dorsal side of MCPs Pain description:  tightness, discomfort Aggravating factors: movement, accidentally bumping it Relieving factors: ice  FALLS: Has patient fallen in last 6 months? Yes. Number of falls - 1 (onset)  LIVING ENVIRONMENT: Lives with: lives with their spouse Lives in: House/apartment Stairs: No Has following equipment at home: None  PLOF: Independent and Vocation/Vocational requirements: Retired  PATIENT GOALS: Return to [employer]  OBJECTIVE:   HAND DOMINANCE: Right  ADLs: Overall ADLs: Able to complete BADLs w/ Mod Ind  FUNCTIONAL OUTCOME MEASURES: Quick Dash: 34/100  UPPER EXTREMITY ROM: R shoulder and elbow ROM WFL; hand/finger AROM to be assessed  Active ROM Right eval  Shoulder flexion   Shoulder abduction   Shoulder adduction   Shoulder extension   Shoulder internal rotation   Shoulder external rotation   Elbow flexion   Elbow extension   Wrist flexion 4  Wrist extension 28  Wrist ulnar deviation   Wrist radial deviation   Wrist pronation 61  Wrist supination 21  (Blank rows = not tested)  Active ROM Right eval  Thumb MCP (0-60)   Thumb IP (0-80)   Thumb Radial abd/add (0-55)   Thumb Palmar abd/add (0-45)   Thumb Opposition to Small Finger   Index MCP (0-90)   Index PIP (0-100)   Index DIP (0-70)    Long MCP (0-90)    Long PIP (0-100)    Long DIP (0-70)    Ring MCP (0-90)    Ring PIP (0-100)    Ring DIP (0-70)    Little MCP (0-90)    Little PIP (0-100)    Little DIP (0-70)    (Blank rows = not tested)   UPPER EXTREMITY MMT: Unable to assess due to medical restrictions  HAND FUNCTION: Unable to assess due to medical restrictions   COORDINATION: Able to oppose thumb to each finger w/ min difficulty opposing to small finger  SENSATION: WFL; reports some numbness along dorsal aspect of MCPJs  EDEMA: Measured circumferentially 5 cm from distal wrist crease; R elbow: 19.1 cm  L elbow 15.9 cm  COGNITION: Overall cognitive status: Within functional limits for  tasks assessed   TODAY'S TREATMENT - 11/20/21: Fabricated custom bivalve, clamshell wrist immobilization orthosis using 1/8" perforated material to immobilize R wrist and help limit swelling. Pt positioned w/ wrist in neutral to very slight extension (limited by dorsal plate) w/ metacarpal bar below distal palmar crease and thumb free; able to oppose thumb to all fingers. All edges smoothed and rounded. Aspect of dorsal component flared out to prevent irritation to ulnar styloid w/ additional stockinette and padding provided to pt. After initial completion, significant modifications required for pt comfort due to report of "pressure" along ulnar borders of both both volar and dorsal components; edges flared and trimmed as much as able while still allowing for appropriate support at the wrist. Orthosis ultimately appeared to fit well w/ no observable areas of pressure. Pt instructed to wear orthosis at all times aside from hygiene care and completing exercises until instructed otherwise by MD. Education provided on purpose of orthosis, wear and  care, as well a potential signs and symptoms of irritation or inadequate fit to be aware of w/ pt encouraged to call back/return ASAP if orthosis is causing irritation or is not achieving desired function. Handout reviewed, signed and administered to pt at conclusion of session. Orthosis will be monitored and adjusted in upcoming sessions prn.    PATIENT EDUCATION: Educated on role and purpose of OT related to pt diagnosis. Introduced gentle scar massage, mid-range forearm AROM, and provided condition-specific education, answering pt questions prn. Person educated: Patient Education method: Customer service manager Education comprehension: verbalized understanding   HOME EXERCISE PROGRAM: Scar massage   GOALS: Goals reviewed with patient? No  SHORT TERM GOALS: Target date:  01/01/22       Status:  1 Pt to demonstrate independence w/ wear and care of  custom orthosis  Met  2 Pt will demonstrate independence w/ initial HEP designed for ROM and light strengthening  Initial  3 Pt will be able to achieve at least 50% of R forearm supination within 6 weeks for improved participation in home maintenance/leisure tasks  Initial    LONG TERM GOALS: Target date:  02/12/22       Status:  1 Pt will increase functional use of R, dominant UE during IADLs as evidenced by improving QuickDASH score from 34% to 10% or better by discharge Baseline: 34% Initial  2 Pt will improve grip strength in R hand to at least 20 lbs for functional use at home and in IADLs   Initial  3 Pt will improve AROM in R wrist to at least 35 degrees of ext, to restore functional motion for tasks like reach and grasp Baseline: To be assessed after removal of dorsal bridge plate Initial  4 Pt to demonstrate independence w/ full HEP designed for increased functional use of R, dominant UE by discharge  Initial    ASSESSMENT:  CLINICAL IMPRESSION: Pt is a 61 y/o who presents to OP OT a little over 4 weeks post-op from ORIF w/ Dr. Tempie Donning on 10/21/21, referred for fabrication of custom-fit wrist immobilization orthosis to prevent movement, protect, and facilitate healing for several weeks. Bi-valve, clamshell orthosis design selected to provide maximum protection considering pt's activity level and impacted structures. Pt was not billed for time spent during resting periods or waiting for materials to be available for fitting/adjustment. AROM and strength is still restricted per protocol and pt reports minimal pain; ROM of wrist extension largely limited by dorsal spanning bridge plate. Pt will benefit from skilled occupational therapy services to address decreased ROM, strength, pain, and implementation and guided progression of HEP per [place] [facility] protocol to improve participation and safety during all ADLs and ensure maximal function use of R, dominant UE.  PERFORMANCE DEFICITS in  functional skills including dexterity, sensation, edema, ROM, strength, pain, flexibility, decreased knowledge of precautions, and UE functional use.  IMPAIRMENTS are limiting patient from ADLs, IADLs, and leisure.   COMORBIDITIES has no other co-morbidities that affects occupational performance. Patient will benefit from skilled OT to address above impairments and improve overall function.  MODIFICATION OR ASSISTANCE TO COMPLETE EVALUATION: No modification of tasks or assist necessary to complete an evaluation.  OT OCCUPATIONAL PROFILE AND HISTORY: Problem focused assessment: Including review of records relating to presenting problem.  CLINICAL DECISION MAKING: Moderate - several treatment options, min-mod task modification necessary  REHAB POTENTIAL: Excellent  EVALUATION COMPLEXITY: Low   PLAN: OT FREQUENCY: 1-2x/week  OT DURATION:  12 sessions - will plan to  complete 4-6 sessions to address pain, edema, and ROM as able; pt will then likely go on-hold to transition to HEP and then return to OP OT a few weeks after dorsal plate removal for ongoing skilled services  PLANNED INTERVENTIONS: self care/ADL training, therapeutic exercise, therapeutic activity, manual therapy, scar mobilization, passive range of motion, splinting, electrical stimulation, ultrasound, iontophoresis, paraffin, fluidotherapy, moist heat, cryotherapy, contrast bath, and patient/family education  RECOMMENDED OTHER SERVICES: None  CONSULTED AND AGREED WITH PLAN OF CARE: Patient  PLAN FOR NEXT SESSION: Orthosis check and modifications prn; review scar massage; tendon gliding exercises; forearm and hand ROM   Kathrine Cords, MSOT, OTR/L  11/20/2021, 12:18 PM

## 2021-11-26 ENCOUNTER — Ambulatory Visit: Payer: Self-pay | Admitting: Occupational Therapy

## 2021-12-02 ENCOUNTER — Encounter: Payer: Self-pay | Admitting: Occupational Therapy

## 2021-12-02 ENCOUNTER — Ambulatory Visit: Payer: Self-pay | Attending: Orthopedic Surgery | Admitting: Occupational Therapy

## 2021-12-02 DIAGNOSIS — R600 Localized edema: Secondary | ICD-10-CM | POA: Insufficient documentation

## 2021-12-02 DIAGNOSIS — M25531 Pain in right wrist: Secondary | ICD-10-CM | POA: Insufficient documentation

## 2021-12-02 DIAGNOSIS — M6281 Muscle weakness (generalized): Secondary | ICD-10-CM | POA: Insufficient documentation

## 2021-12-02 DIAGNOSIS — M25631 Stiffness of right wrist, not elsewhere classified: Secondary | ICD-10-CM | POA: Insufficient documentation

## 2021-12-02 NOTE — Therapy (Unsigned)
OUTPATIENT OCCUPATIONAL THERAPY TREATMENT NOTE  Patient Name: Richard Conrad MRN: [id number] DOB:07/30/1960, 61 y.o., male Today's Date: 12/02/2021  PCP: Vivi Barrack, MD REFERRING PROVIDER: Sherilyn Cooter, MD   OT End of Session - 12/02/21 1236     Visit Number 2    Number of Visits 13    Date for OT Re-Evaluation 02/12/22    Authorization Type self pay    OT Start Time 1232    OT Stop Time 1312    OT Time Calculation (min) 40 min    Activity Tolerance Patient tolerated treatment well    Behavior During Therapy [facility] for tasks assessed/performed            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" (03/23/2016)   History of kidney stones    "passed them"   Hypertension    Hypothyroidism    Iron deficiency anemia    Seasonal allergies    "fall and spring; mild" (03/23/2016)   Past Surgical History:  Procedure Laterality Date   APPENDECTOMY  03/23/2016   COLON RESECTION N/A 03/23/2016   Procedure: LAPAROSCOPIC CECECTOMY;  Surgeon: Coralie Keens, MD;  Location: [place];  Service: General;  Laterality: N/A;   COLON SURGERY  03/23/2016   LAPAROSCOPIC CECECTOMY     COLONOSCOPY     ESOPHAGOGASTRODUODENOSCOPY ENDOSCOPY     OPEN REDUCTION INTERNAL FIXATION (ORIF) DISTAL RADIAL FRACTURE Right 10/21/2021   Procedure: RIGHT OPEN REDUCTION INTERNAL FIXATION (ORIF) DISTAL RADIAL FRACTURE;  Surgeon: Sherilyn Cooter, MD;  Location: [place];  Service: Orthopedics;  Laterality: Right;   RADIAL ARTERY ANEURYSM REPAIR  2019   TONSILLECTOMY     VASECTOMY     Patient Active Problem List   Diagnosis Date Noted   Closed fracture of right distal radius 10/13/2021   Dyslipidemia 04/08/2021   Hyperglycemia 04/08/2021   Benign essential HTN 04/11/2020   Hypothyroidism (acquired) 04/11/2020   Gastroesophageal reflux disease without esophagitis 12/06/2017   Seasonal allergic  rhinitis 12/06/2017   Colon polyp, hyperplastic 03/23/2016    ONSET DATE: DOS: 10/21/21, DOI: 10/11/21  REFERRING DIAG: T90.300P (ICD-10-CM) - Other closed intra-articular fracture of distal end of right radius, initial encounter   THERAPY DIAG:  Pain in right wrist  Stiffness of right wrist, not elsewhere classified  Localized edema  Muscle weakness (generalized)  Rationale for Evaluation and Treatment Rehabilitation  SUBJECTIVE:   SUBJECTIVE STATEMENT: Pt reports he has a follow up w/ Dr. Tempie Donning tomorrow and is looking forward to figuring out a plan to take the dorsal bridge plate out Pt accompanied by: self  PERTINENT HISTORY: comminuted intra-articular R distal radius fracture s/p ORIF 10/21/21 w/ volar distal radius plate as well as a dorsal spanning bridge plate; R ulnar styloid tip fracture  Per CT on 10/14/21: Impacted, displaced and comminuted intra-articular fracture of the distal radius (This fracture demonstrates up to 2 mm of depression of the distal articular surface anteriorly. There is predominately dorsal displacement at the fracture by up to 0.6 cm. There is a large metaphyseal butterfly fragment which is displaced up to 1.3 cm medially. Nondisplaced fracture extends into the distal radioulnar joint); laterally displaced ulnar styloid fracture; no evidence of carpal bone fracture or dislocation  PRECAUTIONS: Other: No lifting greater than 2-5 lbs; immobilization orthosis on at all times other than for hygiene and exercises  WEIGHT BEARING RESTRICTIONS Yes -  NWB RUE  PAIN:  Are you having pain? Yes: NPRS scale: 2/10 Pain location: along radial border of thumb; some numbness across dorsal side of MCPs Pain description: tightness, numbness Aggravating factors: movement, accidentally bumping it Relieving factors: ice  LIVING ENVIRONMENT: Lives with: lives with their spouse Lives in: House/apartment Stairs: No Has following equipment at home: None  PLOF:  Independent and Vocation/Vocational requirements: Retired  PATIENT GOALS: Return to [employer]  OBJECTIVE:   HAND DOMINANCE: Right  ------------------------------------------------------------------------------------------------------------------------------------------------------  UPPER EXTREMITY ROM: R shoulder and elbow ROM WFL; hand/finger AROM to be assessed; wrist ROM limited by dorsal spanning bridge plate  Active ROM Right eval  Shoulder flexion   Shoulder abduction   Shoulder adduction   Shoulder extension   Shoulder internal rotation   Shoulder external rotation   Elbow flexion   Elbow extension   Wrist flexion 4  Wrist extension 28  Wrist ulnar deviation   Wrist radial deviation   Wrist pronation 61  Wrist supination 21  (Blank rows = not tested)  Active ROM Right 8/2  Thumb MCP (0-60) 34  Thumb IP (0-80) 69  Thumb Radial abd/add (0-55)   Thumb Palmar abd/add (0-45)   Thumb Opposition to Small Finger   Index MCP (0-90) 51  Index PIP (0-100)   Index DIP (0-70)    Long MCP (0-90) 64   Long PIP (0-100)    Long DIP (0-70)    Ring MCP (0-90) 66  Ring PIP (0-100)    Ring DIP (0-70)    Little MCP (0-90) 78  Little PIP (0-100)    Little DIP (0-70)    (Blank rows = not tested)  ------------------------------------------------------------------------------------------------------------------------------------------------------  TODAY'S TREATMENT - 11/20/21:  Orthosis Management Minor adjustments made for custom bivalve, clamshell wrist immobilization orthosis fabricated in prior evaluation session in order to improve positioning, prevent areas of pressure, and increase comfort  Scar Massage Scar massage completed around volar and dorsal aspect of R forearm, wrist, and hand in order to facilitate mobility within tissue structures and prevent adhesions for overall increased ROM. Pt tolerated intervention w/out discomfort w/ OT providing corresponding education for  incorporation of massage at home; pt verbalized understanding   Forearm Exercises Completed AROM of forearm supination/pronation w/ arm adducted to side. OT provided verbal cues for alignment and positioning prn w/ pt able to complete w/out difficulty and exercises included in HEP  Tendon Gliding Tendon gliding (open hand, hook fist, full fist, MPJ flexion, straight/flat fist) for R hand; OT providing corresponding education and initial cues for understanding, gradation, and pacing w/ pt returning demonstration w/out difficulty    PATIENT EDUCATION: Education provided on gentle scar massage, AROM exercises, and ongoing condition-specific education, answering pt questions prn Person educated: Patient Education method: Customer service manager Education comprehension: verbalized understanding   HOME EXERCISE PROGRAM: Scar massage   GOALS: Goals reviewed with patient? No  SHORT TERM GOALS: Target date:  01/01/22       Status:  1 Pt to demonstrate independence w/ wear and care of custom orthosis  Met  2 Pt will demonstrate independence w/ initial HEP designed for ROM and light strengthening  Progressing  3 Pt will be able to achieve at least 50% of R forearm supination within 6 weeks for improved participation in home maintenance/leisure tasks  Progressing    LONG TERM GOALS: Target date:  02/12/22       Status:  1 Pt will increase functional use of R, dominant UE during IADLs as evidenced by  improving QuickDASH score from 34% to 10% or better by discharge Baseline: 34% Progressing  2 Pt will improve grip strength in R hand to at least 20 lbs for functional use at home and in IADLs   Progressing  3 Pt will improve AROM in R wrist to at least 35 degrees of ext, to restore functional motion for tasks like reach and grasp Baseline: To be assessed after removal of dorsal bridge plate Progressing  4 Pt to demonstrate independence w/ full HEP designed for increased functional use of  R, dominant UE by discharge  Progressing    ASSESSMENT:  CLINICAL IMPRESSION: Pt arrives for first treatment session following initial evaluation almost 2 weeks ago and is currently 6 weeks s/p ORIF on 10/21/21 for R distal radius fracture w/ secondary small ulnar styloid tip fracture. Session today focused on continued edema, scar, and orthosis management, as well as introduction of AROM exercises targeting tendon excursion and ROM within reasonable restrictions of dorsal spanning bridge plate. Pt is very active at home and continues to report no pain.  PERFORMANCE DEFICITS in functional skills including dexterity, sensation, edema, ROM, strength, pain, flexibility, decreased knowledge of precautions, and UE functional use.  IMPAIRMENTS are limiting patient from ADLs, IADLs, and leisure.   COMORBIDITIES has no other co-morbidities that affects occupational performance. Patient will benefit from skilled OT to address above impairments and improve overall function.   PLAN: OT FREQUENCY: 1-2x/week  OT DURATION:  12 sessions - will plan to complete 4-6 sessions to address pain, edema, and ROM as able; pt will then likely go on-hold to transition to HEP and then return to OP OT a few weeks after dorsal plate removal for ongoing skilled services  PLANNED INTERVENTIONS: self care/ADL training, therapeutic exercise, therapeutic activity, manual therapy, scar mobilization, passive range of motion, splinting, electrical stimulation, ultrasound, iontophoresis, paraffin, fluidotherapy, moist heat, cryotherapy, contrast bath, and patient/family education  RECOMMENDED OTHER SERVICES: None  CONSULTED AND AGREED WITH PLAN OF CARE: Patient  PLAN FOR NEXT SESSION: Orthosis check/modifications prn; review tendon gliding, forearm and hand ROM   Kathrine Cords, MSOT, OTR/L  12/02/2021, 1:36 PM

## 2021-12-03 ENCOUNTER — Ambulatory Visit (INDEPENDENT_AMBULATORY_CARE_PROVIDER_SITE_OTHER): Payer: Self-pay | Admitting: Orthopedic Surgery

## 2021-12-03 ENCOUNTER — Ambulatory Visit (INDEPENDENT_AMBULATORY_CARE_PROVIDER_SITE_OTHER): Payer: Self-pay

## 2021-12-03 DIAGNOSIS — S52571A Other intraarticular fracture of lower end of right radius, initial encounter for closed fracture: Secondary | ICD-10-CM

## 2021-12-03 NOTE — Progress Notes (Signed)
Office Visit Note   Patient: Richard Conrad           Date of Birth: 02-13-1961           MRN: [id number] Visit Date: 12/03/2021              Requested by: Vivi Barrack, MD [address] [place],  [place] [address] PCP: Vivi Barrack, MD   Assessment & Plan: Visit Diagnoses:  1. Other closed intra-articular fracture of distal end of right radius, initial encounter     Plan: Patient is 6 weeks out from ORIF of his distal radius fracture.  He is doing well postoperatively.  He has full ROM of his fingers with much improved grip strength.  He has full pronation with near full supination.  Repeat x-rays today show maintained fracture alignment with congruent articular surface.  He will continue with therapy. I will see him in another three weeks or so with repeat x-rays.   Follow-Up Instructions: No follow-ups on file.   Orders:  Orders Placed This Encounter  Procedures   XR Wrist Complete Right   No orders of the defined types were placed in this encounter.     Procedures: No procedures performed   Clinical Data: No additional findings.   Subjective: Chief Complaint  Patient presents with   Right Wrist - Follow-up, Fracture    HPI  Review of Systems   Objective: Vital Signs: There were no vitals taken for this visit.  Physical Exam  Ortho Exam  Specialty Comments:  No specialty comments available.  Imaging: No results found.   PMFS History: Patient Active Problem List   Diagnosis Date Noted   Closed fracture of right distal radius 10/13/2021   Dyslipidemia 04/08/2021   Hyperglycemia 04/08/2021   Benign essential HTN 04/11/2020   Hypothyroidism (acquired) 04/11/2020   Gastroesophageal reflux disease without esophagitis 12/06/2017   Seasonal allergic rhinitis 12/06/2017   Colon polyp, hyperplastic 03/23/2016   Past Medical History:  Diagnosis Date   GERD (gastroesophageal reflux disease)    "mild" (03/23/2016)   Heart murmur    "born w/one;  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" (03/23/2016)   History of kidney stones    "passed them"   Hypertension    Hypothyroidism    Iron deficiency anemia    Seasonal allergies    "fall and spring; mild" (03/23/2016)    Family History  Problem Relation Age of Onset   Cancer Mother    Hyperlipidemia Mother    Cancer Father    Hyperlipidemia Father     Past Surgical History:  Procedure Laterality Date   APPENDECTOMY  03/23/2016   COLON RESECTION N/A 03/23/2016   Procedure: LAPAROSCOPIC CECECTOMY;  Surgeon: Coralie Keens, MD;  Location: [place];  Service: General;  Laterality: N/A;   COLON SURGERY  03/23/2016   LAPAROSCOPIC CECECTOMY     COLONOSCOPY     ESOPHAGOGASTRODUODENOSCOPY ENDOSCOPY     OPEN REDUCTION INTERNAL FIXATION (ORIF) DISTAL RADIAL FRACTURE Right 10/21/2021   Procedure: RIGHT OPEN REDUCTION INTERNAL FIXATION (ORIF) DISTAL RADIAL FRACTURE;  Surgeon: Sherilyn Cooter, MD;  Location: [place];  Service: Orthopedics;  Laterality: Right;   RADIAL ARTERY ANEURYSM REPAIR  2019   TONSILLECTOMY     VASECTOMY     Social History   Occupational History   Not on file  Tobacco Use   Smoking status: Never   Smokeless tobacco: Never  Vaping Use   Vaping Use: Never used  Substance and Sexual Activity  Alcohol use: No    Alcohol/week: 0.0 standard drinks of alcohol   Drug use: No   Sexual activity: Yes

## 2021-12-09 ENCOUNTER — Ambulatory Visit: Payer: Self-pay | Admitting: Occupational Therapy

## 2021-12-09 ENCOUNTER — Encounter: Payer: Self-pay | Admitting: Occupational Therapy

## 2021-12-09 DIAGNOSIS — R600 Localized edema: Secondary | ICD-10-CM

## 2021-12-09 DIAGNOSIS — M25631 Stiffness of right wrist, not elsewhere classified: Secondary | ICD-10-CM

## 2021-12-09 DIAGNOSIS — M25531 Pain in right wrist: Secondary | ICD-10-CM

## 2021-12-09 DIAGNOSIS — M6281 Muscle weakness (generalized): Secondary | ICD-10-CM

## 2021-12-09 NOTE — Therapy (Signed)
OUTPATIENT OCCUPATIONAL THERAPY TREATMENT NOTE  Patient Name: Richard Conrad MRN: [id number] DOB:1961/03/08, 61 y.o., male Today's Date: 12/09/2021  PCP: Vivi Barrack, MD REFERRING PROVIDER: Sherilyn Cooter, MD   OT End of Session - 12/09/21 1235     Visit Number 3    Number of Visits 13    Date for OT Re-Evaluation 02/12/22    Authorization Type self pay    OT Start Time 1232    OT Stop Time 1315    OT Time Calculation (min) 43 min    Activity Tolerance Patient tolerated treatment well    Behavior During Therapy [facility] for tasks assessed/performed            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" (03/23/2016)   History of kidney stones    "passed them"   Hypertension    Hypothyroidism    Iron deficiency anemia    Seasonal allergies    "fall and spring; mild" (03/23/2016)   Past Surgical History:  Procedure Laterality Date   APPENDECTOMY  03/23/2016   COLON RESECTION N/A 03/23/2016   Procedure: LAPAROSCOPIC CECECTOMY;  Surgeon: Coralie Keens, MD;  Location: [place];  Service: General;  Laterality: N/A;   COLON SURGERY  03/23/2016   LAPAROSCOPIC CECECTOMY     COLONOSCOPY     ESOPHAGOGASTRODUODENOSCOPY ENDOSCOPY     OPEN REDUCTION INTERNAL FIXATION (ORIF) DISTAL RADIAL FRACTURE Right 10/21/2021   Procedure: RIGHT OPEN REDUCTION INTERNAL FIXATION (ORIF) DISTAL RADIAL FRACTURE;  Surgeon: Sherilyn Cooter, MD;  Location: [place];  Service: Orthopedics;  Laterality: Right;   RADIAL ARTERY ANEURYSM REPAIR  2019   TONSILLECTOMY     VASECTOMY     Patient Active Problem List   Diagnosis Date Noted   Closed fracture of right distal radius 10/13/2021   Dyslipidemia 04/08/2021   Hyperglycemia 04/08/2021   Benign essential HTN 04/11/2020   Hypothyroidism (acquired) 04/11/2020   Gastroesophageal reflux disease without esophagitis 12/06/2017   Seasonal allergic  rhinitis 12/06/2017   Colon polyp, hyperplastic 03/23/2016    ONSET DATE: DOS: 10/21/21, DOI: 10/11/21  REFERRING DIAG: Q00.867Y (ICD-10-CM) - Other closed intra-articular fracture of distal end of right radius, initial encounter   THERAPY DIAG:  Pain in right wrist  Stiffness of right wrist, not elsewhere classified  Localized edema  Muscle weakness (generalized)  Rationale for Evaluation and Treatment Rehabilitation  SUBJECTIVE:   SUBJECTIVE STATEMENT: Pt reports the follow up w/ Dr. Tempie Donning went well last week and is anticipating getting the dorsal bridge plate out around mid-September Pt accompanied by: self  PERTINENT HISTORY: comminuted intra-articular R distal radius fracture s/p ORIF 10/21/21 w/ volar distal radius plate as well as a dorsal spanning bridge plate; R ulnar styloid tip fracture  Per CT on 10/14/21: Impacted, displaced and comminuted intra-articular fracture of the distal radius (This fracture demonstrates up to 2 mm of depression of the distal articular surface anteriorly. There is predominately dorsal displacement at the fracture by up to 0.6 cm. There is a large metaphyseal butterfly fragment which is displaced up to 1.3 cm medially. Nondisplaced fracture extends into the distal radioulnar joint); laterally displaced ulnar styloid fracture; no evidence of carpal bone fracture or dislocation  PRECAUTIONS: Other: No lifting greater than 2-5 lbs; immobilization orthosis on at all times other than for hygiene and exercises  WEIGHT BEARING RESTRICTIONS Yes - RUE WBAT  PAIN: Are you having pain? Yes: NPRS scale: 1-2/10 Pain location: along dorsal side of the hand and dorsal border of thumb Pain description: soreness; numbness/tightness Aggravating factors: flexion Relieving factors: not necessary  PLOF: Independent and Vocation/Vocational requirements: Retired  PATIENT GOALS: Return to PLOF  OBJECTIVE:   HAND DOMINANCE:  Right  ------------------------------------------------------------------------------------------------------------------------------------------------------  UPPER EXTREMITY ROM: R shoulder and elbow ROM WFL; hand/finger AROM to be assessed; wrist ROM limited by dorsal spanning bridge plate  Active ROM Right Eval - 7/21  Wrist flexion 4  Wrist extension 28  Wrist ulnar deviation unable  Wrist radial deviation unable  Wrist pronation 61  Wrist supination 21  (Blank rows = not tested)  Active ROM Right 8/2  Thumb MCP (0-60) 34  Thumb IP (0-80) 69  Thumb Radial abd/add (0-55)   Thumb Palmar abd/add (0-45)   Thumb Opposition to Small Finger   Index MCP (0-90) 51  Index PIP (0-100)   Index DIP (0-70)    Long MCP (0-90) 64   Long PIP (0-100)    Long DIP (0-70)    Ring MCP (0-90) 66  Ring PIP (0-100)    Ring DIP (0-70)    Little MCP (0-90) 78  Little PIP (0-100)    Little DIP (0-70)    (Blank rows = not tested)  ------------------------------------------------------------------------------------------------------------------------------------------------------  TODAY'S TREATMENT - 11/20/21:  Scar Massage Scar massage completed around volar and dorsal aspect of R forearm, wrist, and hand in order to facilitate mobility within tissue structures and prevent adhesions for overall increased ROM. Pt tolerated intervention w/out reported or observable discomfort  PROM OT performed gentle PROM of R forearm supination, thumb, and finger flex/ext of digits 2-4 incorporating passive stretch held at end range prn to increase ROM and decrease stiffness and potential adhesions. Pt tolerated exercise w/out discomfort; PROM limited by dorsal spanning bridge plate   AROM Isolated tendon excursion exercises for R hand and thumb completed w/ OT providing initial demonstration/verbal cues for alignment and positioning prn w/ pt able to complete w/out difficulty    PATIENT EDUCATION: Ongoing  condition-specific education related to therapeutic interventions completed this session; answered pt questions prn Person educated: Patient Education method: Customer service manager Education comprehension: verbalized understanding   HOME EXERCISE PROGRAM: Scar massage   GOALS: Goals reviewed with patient? No  SHORT TERM GOALS: Target date:  01/01/22       Status:  1 Pt to demonstrate independence w/ wear and care of custom orthosis  Met  2 Pt will demonstrate independence w/ initial HEP designed for ROM and light strengthening  Progressing  3 Pt will be able to achieve at least 50% of R forearm supination within 6 weeks for improved participation in home maintenance/leisure tasks  Progressing    LONG TERM GOALS: Target date:  02/12/22       Status:  1 Pt will increase functional use of R, dominant UE during IADLs as evidenced by improving QuickDASH score from 34% to 10% or better by discharge Baseline: 34% Progressing  2 Pt will improve grip strength in R hand to at least 20 lbs for functional use at home and in IADLs   Progressing  3 Pt will improve AROM in R wrist to at least 35 degrees of ext, to restore functional motion for tasks like reach and grasp Baseline: To be assessed after removal of dorsal bridge plate Progressing  4 Pt to demonstrate independence w/ full HEP designed for increased functional use of R, dominant UE by  discharge  Progressing    ASSESSMENT:  CLINICAL IMPRESSION: Pt is currently 7 weeks s/p ORIF on 10/21/21 for R distal radius fracture w/ secondary small ulnar styloid tip fracture. Session today focused on continued edema, scar, and orthosis management, as well as AROM exercises targeting tendon excursion and ROM within reasonable restrictions of dorsal spanning bridge plate. Pt is very active at home and continues to report no pain.  PERFORMANCE DEFICITS in functional skills including dexterity, sensation, edema, ROM, strength, pain,  flexibility, decreased knowledge of precautions, and UE functional use.  IMPAIRMENTS are limiting patient from ADLs, IADLs, and leisure.   COMORBIDITIES has no other co-morbidities that affects occupational performance. Patient will benefit from skilled OT to address above impairments and improve overall function.   PLAN: OT FREQUENCY: 1-2x/week  OT DURATION:  12 sessions - will plan to complete 4-6 sessions to address pain, edema, and ROM as able; pt will then likely go on-hold to transition to HEP and then return to OP OT a few weeks after dorsal plate removal for ongoing skilled services  PLANNED INTERVENTIONS: self care/ADL training, therapeutic exercise, therapeutic activity, manual therapy, scar mobilization, passive range of motion, splinting, electrical stimulation, ultrasound, iontophoresis, paraffin, fluidotherapy, moist heat, cryotherapy, contrast bath, and patient/family education  RECOMMENDED OTHER SERVICES: None  CONSULTED AND AGREED WITH PLAN OF CARE: Patient  PLAN FOR NEXT SESSION: Orthosis check/modifications prn; review tendon gliding, forearm and hand ROM   Kathrine Cords, MSOT, OTR/L  12/09/2021, 1:49 PM

## 2021-12-16 ENCOUNTER — Encounter: Payer: Self-pay | Admitting: Occupational Therapy

## 2021-12-16 ENCOUNTER — Ambulatory Visit: Payer: Self-pay | Admitting: Occupational Therapy

## 2021-12-16 DIAGNOSIS — M25631 Stiffness of right wrist, not elsewhere classified: Secondary | ICD-10-CM

## 2021-12-16 DIAGNOSIS — R600 Localized edema: Secondary | ICD-10-CM

## 2021-12-16 DIAGNOSIS — M6281 Muscle weakness (generalized): Secondary | ICD-10-CM

## 2021-12-16 DIAGNOSIS — M25531 Pain in right wrist: Secondary | ICD-10-CM

## 2021-12-16 NOTE — Therapy (Signed)
OUTPATIENT OCCUPATIONAL THERAPY TREATMENT NOTE  Patient Name: Richard Conrad MRN: [id number] DOB:[birth year], 61 y.o., male Today's Date: 12/16/2021  PCP: Vivi Barrack, MD REFERRING PROVIDER: Sherilyn Cooter, MD   OT End of Session - 12/16/21 1024     Visit Number 4    Number of Visits 13    Date for OT Re-Evaluation 02/12/22    Authorization Type self pay    OT Start Time 1019    OT Stop Time 1100    OT Time Calculation (min) 41 min    Activity Tolerance Patient tolerated treatment well    Behavior During Therapy WFL for tasks assessed/performed            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" (03/23/2016)   History of kidney stones    "passed them"   Hypertension    Hypothyroidism    Iron deficiency anemia    Seasonal allergies    "fall and spring; mild" (03/23/2016)   Past Surgical History:  Procedure Laterality Date   APPENDECTOMY  03/23/2016   COLON RESECTION N/A 03/23/2016   Procedure: LAPAROSCOPIC CECECTOMY;  Surgeon: Coralie Keens, MD;  Location: [place];  Service: General;  Laterality: N/A;   COLON SURGERY  03/23/2016   LAPAROSCOPIC CECECTOMY     COLONOSCOPY     ESOPHAGOGASTRODUODENOSCOPY ENDOSCOPY     OPEN REDUCTION INTERNAL FIXATION (ORIF) DISTAL RADIAL FRACTURE Right 10/21/2021   Procedure: RIGHT OPEN REDUCTION INTERNAL FIXATION (ORIF) DISTAL RADIAL FRACTURE;  Surgeon: Sherilyn Cooter, MD;  Location: [place];  Service: Orthopedics;  Laterality: Right;   RADIAL ARTERY ANEURYSM REPAIR  2019   TONSILLECTOMY     VASECTOMY     Patient Active Problem List   Diagnosis Date Noted   Closed fracture of right distal radius 10/13/2021   Dyslipidemia 04/08/2021   Hyperglycemia 04/08/2021   Benign essential HTN 04/11/2020   Hypothyroidism (acquired) 04/11/2020   Gastroesophageal reflux disease without esophagitis 12/06/2017   Seasonal  allergic rhinitis 12/06/2017   Colon polyp, hyperplastic 03/23/2016    ONSET DATE: DOS: 10/21/21, DOI: 10/11/21  REFERRING DIAG: J62.831D (ICD-10-CM) - Other closed intra-articular fracture of distal end of right radius, initial encounter   THERAPY DIAG:  Pain in right wrist  Stiffness of right wrist, not elsewhere classified  Localized edema  Muscle weakness (generalized)  Rationale for Evaluation and Treatment Rehabilitation  SUBJECTIVE:   SUBJECTIVE STATEMENT: Pt reports his orthosis is "off more than it's on" at home Pt accompanied by: self  PERTINENT HISTORY: comminuted intra-articular R distal radius fracture s/p ORIF 10/21/21 w/ volar distal radius plate as well as a dorsal spanning bridge plate; R ulnar styloid tip fracture  Per CT on 10/14/21: Impacted, displaced and comminuted intra-articular fracture of the distal radius (This fracture demonstrates up to 2 mm of depression of the distal articular surface anteriorly. There is predominately dorsal displacement at the fracture by up to 0.6 cm. There is a large metaphyseal butterfly fragment which is displaced up to 1.3 cm medially. Nondisplaced fracture extends into the distal radioulnar joint); laterally displaced ulnar styloid fracture; no evidence of carpal bone fracture or dislocation  PRECAUTIONS: Other: No lifting > 5 lbs; immobilization orthosis on other than for hygiene and exercises  WEIGHT BEARING RESTRICTIONS Yes - RUE WBAT  PAIN: Are you having pain? Yes: NPRS scale: 1-2/10 Pain location: along dorsal side of  the hand and dorsal border of thumb Pain description: soreness; numbness/tightness Aggravating factors: specific movements Relieving factors: not needed  PLOF: Independent and Vocation/Vocational requirements: Retired  PATIENT GOALS: Return to PLOF  OBJECTIVE:   HAND DOMINANCE:  Right  ------------------------------------------------------------------------------------------------------------------------------------------------------  UPPER EXTREMITY ROM: R shoulder and elbow ROM WFL; hand/finger AROM to be assessed; wrist ROM limited by dorsal spanning bridge plate  Active ROM Right Eval - 7/21 Right 8/16  Wrist flexion 4 4  Wrist extension 28 12  Wrist ulnar deviation unable   Wrist radial deviation unable   Wrist pronation 61 64  Wrist supination 21 63  (Blank rows = not tested)  Active ROM Right 8/2 Right 8/16  Thumb MCP (0-60) 34 37  Thumb IP (0-80) 69 70  Thumb Radial abd/add (0-55)  42  Thumb Palmar abd/add (0-45)  42  Thumb Opposition to Small Finger  0  Index MCP (0-90) 51 57  Index PIP (0-100)    Index DIP (0-70)     Long MCP (0-90) 64  78  Long PIP (0-100)     Long DIP (0-70)     Ring MCP (0-90) 66 79  Ring PIP (0-100)     Ring DIP (0-70)     Little MCP (0-90) 78 82  Little PIP (0-100)     Little DIP (0-70)     (Blank rows = not tested)  ------------------------------------------------------------------------------------------------------------------------------------------------------  TODAY'S TREATMENT:  PROM OT performed gentle PROM of R composite thumb flexion and composite finger flex/ext of index and long finger 10x each, incorporating passive stretch held at end range prn to increase ROM and decrease stiffness and potential adhesions. Pt tolerated exercise w/out discomfort; PROM somewhat limited by dorsal spanning bridge plate. Introduced self-PROM w/ pt returning demonstration w/out difficulty  HEP Reviewed current HEP including AROM of forearm sup/pro; isolated finger extension, focusing on index and long fingers; and finger PIPJ and DIPJ flexion w/ blocking of other joints. Pt able to return demonstration w/out difficulty    PATIENT EDUCATION: Ongoing condition-specific education related to therapeutic interventions  completed this session; discussed POC w/ pt agreeable to plan at this time Person educated: Patient Education method: Explanation Education comprehension: verbalized understanding   HOME EXERCISE PROGRAM: Continue with scar massage; tendon gliding (printed handout administered);  MedBridge Access Code: [email or web address] URL: [email or web address] Date: 12/18/2021 Prepared by: Merleen Nicely, OTR/L  Program Notes *During each finger exercise, focus on your index and long finger  Exercises - Seated Forearm Pronation and Supination AROM  - 3 x daily - 25 reps - Seated Single Finger Extension  - 3 x daily - 25 reps - Seated Isolated Finger PIP Flexion AROM  - 3 x daily - 25 reps - Seated Finger DIP Flexion AROM with Blocking  - 3 x daily - 25 reps - Seated Thumb IP Flexion AROM with Blocking  - 3 x daily - 25 reps - Seated Finger Composite Flexion Stretch  - 3 x daily - 10 reps - 5-10 sec hold - Thumb PROM Composite Flexion  - 3 x daily - 10 reps - 5-10 sec hold   GOALS: Goals reviewed with patient? No  SHORT TERM GOALS: Target date:  01/01/22       Status:  1 Pt to demonstrate independence w/ wear and care of custom orthosis  Met - 12/02/21  2 Pt will demonstrate independence w/ initial HEP designed for ROM and light strengthening  Met - 12/16/21  3 Pt will be able to achieve at least 50%  of R forearm supination within 6 weeks for improved participation in home maintenance/leisure tasks  Met - 12/16/21    LONG TERM GOALS: Target date:  02/12/22       Status:  1 Pt will increase functional use of R, dominant UE during IADLs as evidenced by improving QuickDASH score from 34% to 10% or better by discharge Baseline: 34% Progressing  2 Pt will improve grip strength in R hand to at least 20 lbs for functional use at home and in IADLs   Progressing  3 Pt will improve AROM in R wrist to at least 35 degrees of ext, to restore functional motion for tasks like reach and grasp Baseline: To  be assessed after removal of dorsal bridge plate Progressing  4 Pt to demonstrate independence w/ full HEP designed for increased functional use of R, dominant UE by discharge  Progressing    ASSESSMENT:  CLINICAL IMPRESSION: Pt is currently 8 weeks s/p ORIF on 10/21/21 for R distal radius fracture w/ secondary small ulnar styloid tip fracture. Session today continued focus on scar massage, edema/pain management, and HEP including AROM and PROM exercises w/ emphasis on forearm supination, tendon excursion, and thumb/finger mobility. Pt has shown improvements in all areas addressed in therapy w/ OT completing reassessment of forearm, wrist, and hand AROM, demonstrating good improvement considering restrictions of dorsal spanning bridge plate. Pt reports minimal functional concerns within restrictions, and continues to be very active w/ report of no pain w/ very mild tightness/soreness. At this time, pt is appropriate to be deferred for continued focus on HEP and then returning to skilled OT after removal of dorsal plate to further address ROM and strength for maximum functional use of R, dominant UE. Pt encouraged to call back before that time with any changes or concerns. Pt verbalized understanding, demonstrates independence w/ current HEP, and is agreeable to plan at this time.  PERFORMANCE DEFICITS in functional skills including dexterity, sensation, edema, ROM, strength, pain, flexibility, decreased knowledge of precautions, and UE functional use.  IMPAIRMENTS are limiting patient from ADLs, IADLs, and leisure.   COMORBIDITIES has no other co-morbidities that affects occupational performance. Patient will benefit from skilled OT to address above impairments and improve overall function.   PLAN: OT FREQUENCY: 1-2x/week  OT DURATION:  12 sessions - pt has completed 4 sessions addressing pain, edema, and ROM as able; pt to go on-hold to transition to HEP and then return to OP OT after dorsal  plate removal for ongoing skilled services  PLANNED INTERVENTIONS: self care/ADL training, therapeutic exercise, therapeutic activity, manual therapy, scar mobilization, passive range of motion, splinting, electrical stimulation, ultrasound, iontophoresis, paraffin, fluidotherapy, moist heat, cryotherapy, contrast bath, and patient/family education  RECOMMENDED OTHER SERVICES: None  CONSULTED AND AGREED WITH PLAN OF CARE: Patient  PLAN FOR NEXT SESSION: Pt to be put on hold after today until removal of dorsal spanning bridge plate (anticipated around mid-September) and subsequent MD clearance to return to therapy   Kathrine Cords, MSOT, OTR/L  12/16/2021, 11:24 AM

## 2021-12-18 NOTE — Patient Instructions (Signed)
MedBridge Home Exercise Program  Access Code: [email or web address] URL: [email or web address] Prepared by: Merleen Nicely, OTR/L  Program Notes *During each finger exercise, focus primarily on your index and long finger  Exercises - Seated Forearm Pronation and Supination AROM  - 3 x daily - 25 reps - Seated Single Finger Extension  - 3 x daily - 25 reps - Seated Isolated Finger PIP Flexion AROM  - 3 x daily - 25 reps - Seated Finger DIP Flexion AROM with Blocking  - 3 x daily - 25 reps - Seated Thumb IP Flexion AROM with Blocking  - 3 x daily - 25 reps - Seated Finger Composite Flexion Stretch  - 3 x daily - 10 reps - 5-10 sec hold - Thumb PROM Composite Flexion  - 3 x daily - 10 reps - 5-10 sec hold

## 2021-12-23 ENCOUNTER — Encounter: Payer: Self-pay | Admitting: Occupational Therapy

## 2021-12-24 ENCOUNTER — Encounter: Payer: Self-pay | Admitting: Orthopedic Surgery

## 2021-12-24 ENCOUNTER — Ambulatory Visit: Payer: Self-pay

## 2021-12-24 ENCOUNTER — Ambulatory Visit (INDEPENDENT_AMBULATORY_CARE_PROVIDER_SITE_OTHER): Payer: Self-pay | Admitting: Orthopedic Surgery

## 2021-12-24 DIAGNOSIS — S52571A Other intraarticular fracture of lower end of right radius, initial encounter for closed fracture: Secondary | ICD-10-CM

## 2021-12-24 NOTE — Progress Notes (Signed)
   Post-Op Visit Note   Patient: Richard Conrad           Date of Birth: [birth year]           MRN: [id number] Visit Date: 12/24/2021 PCP: Vivi Barrack, MD   Assessment & Plan:  Chief Complaint:  Chief Complaint  Patient presents with   Right Wrist - Pain   Visit Diagnoses:  1. Other closed intra-articular fracture of distal end of right radius, initial encounter     Plan: Patient is 9 weeks s/p ORIF of his severely comminuted right distal radius fracture with both volar and dorsal bridge plating.  The x-rays today show continued fracture healing.  He is able to make a full fist.  He has near full pronation and supination of the forearm.  We will plan to take the plate out at the 12 week mark.  I can see him the week before with repeat x-rays.   Follow-Up Instructions: No follow-ups on file.   Orders:  Orders Placed This Encounter  Procedures   XR Wrist Complete Right   No orders of the defined types were placed in this encounter.   Imaging: No results found.  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" (03/23/2016)   History of kidney stones    "passed them"   Hypertension    Hypothyroidism    Iron deficiency anemia    Seasonal allergies    "fall and spring; mild" (03/23/2016)    Family History  Problem Relation Age of Onset   Cancer Mother    Hyperlipidemia Mother    Cancer Father    Hyperlipidemia Father     Past Surgical History:  Procedure Laterality Date    APPENDECTOMY  03/23/2016   COLON RESECTION N/A 03/23/2016   Procedure: LAPAROSCOPIC CECECTOMY;  Surgeon: Coralie Keens, MD;  Location: [place];  Service: General;  Laterality: N/A;   COLON SURGERY  03/23/2016   LAPAROSCOPIC CECECTOMY     COLONOSCOPY     ESOPHAGOGASTRODUODENOSCOPY ENDOSCOPY     OPEN REDUCTION INTERNAL FIXATION (ORIF) DISTAL RADIAL FRACTURE Right 10/21/2021   Procedure: RIGHT OPEN REDUCTION INTERNAL FIXATION (ORIF) DISTAL RADIAL FRACTURE;  Surgeon: Sherilyn Cooter, MD;  Location: [place];  Service: Orthopedics;  Laterality: Right;   RADIAL ARTERY ANEURYSM REPAIR  2019   TONSILLECTOMY     VASECTOMY     Social History   Occupational History   Not on file  Tobacco Use   Smoking status: Never   Smokeless tobacco: Never  Vaping Use   Vaping Use: Never used  Substance and Sexual Activity   Alcohol use: No    Alcohol/week: 0.0 standard drinks of alcohol   Drug use: No   Sexual activity: Yes

## 2021-12-30 ENCOUNTER — Encounter: Payer: Self-pay | Admitting: Occupational Therapy

## 2021-12-30 ENCOUNTER — Ambulatory Visit: Payer: Self-pay | Admitting: Occupational Therapy

## 2021-12-30 DIAGNOSIS — R600 Localized edema: Secondary | ICD-10-CM

## 2021-12-30 DIAGNOSIS — M6281 Muscle weakness (generalized): Secondary | ICD-10-CM

## 2021-12-30 DIAGNOSIS — M25531 Pain in right wrist: Secondary | ICD-10-CM

## 2021-12-30 DIAGNOSIS — M25631 Stiffness of right wrist, not elsewhere classified: Secondary | ICD-10-CM

## 2021-12-30 NOTE — Therapy (Signed)
OUTPATIENT OCCUPATIONAL THERAPY TREATMENT NOTE  Patient Name: Richard Conrad MRN: [id number] DOB:1961-02-26, 61 y.o., male Today's Date: 12/30/2021  PCP: Vivi Barrack, MD REFERRING PROVIDER: Sherilyn Cooter, MD   OT End of Session - 12/30/21 1021     Visit Number 5    Number of Visits 13    Date for OT Re-Evaluation 02/12/22    Authorization Type self pay    OT Start Time 1018    OT Stop Time 1050    OT Time Calculation (min) 32 min    Activity Tolerance Patient tolerated treatment well    Behavior During Therapy WFL for tasks assessed/performed            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" (03/23/2016)   History of kidney stones    "passed them"   Hypertension    Hypothyroidism    Iron deficiency anemia    Seasonal allergies    "fall and spring; mild" (03/23/2016)   Past Surgical History:  Procedure Laterality Date   APPENDECTOMY  03/23/2016   COLON RESECTION N/A 03/23/2016   Procedure: LAPAROSCOPIC CECECTOMY;  Surgeon: Coralie Keens, MD;  Location: [place];  Service: General;  Laterality: N/A;   COLON SURGERY  03/23/2016   LAPAROSCOPIC CECECTOMY     COLONOSCOPY     ESOPHAGOGASTRODUODENOSCOPY ENDOSCOPY     OPEN REDUCTION INTERNAL FIXATION (ORIF) DISTAL RADIAL FRACTURE Right 10/21/2021   Procedure: RIGHT OPEN REDUCTION INTERNAL FIXATION (ORIF) DISTAL RADIAL FRACTURE;  Surgeon: Sherilyn Cooter, MD;  Location: [place];  Service: Orthopedics;  Laterality: Right;   RADIAL ARTERY ANEURYSM REPAIR  2019   TONSILLECTOMY     VASECTOMY     Patient Active Problem List   Diagnosis Date Noted   Closed fracture of right distal radius 10/13/2021   Dyslipidemia 04/08/2021   Hyperglycemia 04/08/2021   Benign essential HTN 04/11/2020   Hypothyroidism (acquired) 04/11/2020   Gastroesophageal reflux disease without esophagitis 12/06/2017   Seasonal  allergic rhinitis 12/06/2017   Colon polyp, hyperplastic 03/23/2016    ONSET DATE: DOS: 10/21/21, DOI: 10/11/21  REFERRING DIAG: I45.809X (ICD-10-CM) - Other closed intra-articular fracture of distal end of right radius, initial encounter   THERAPY DIAG:  Pain in right wrist  Stiffness of right wrist, not elsewhere classified  Localized edema  Muscle weakness (generalized)  Rationale for Evaluation and Treatment Rehabilitation  SUBJECTIVE:   SUBJECTIVE STATEMENT: Pt reports they have set the tentative date to get the plate removed on 8/33/82 and will get stiches taken out 10 days after Pt accompanied by: self  PERTINENT HISTORY: comminuted intra-articular R distal radius fracture s/p ORIF 10/21/21 w/ volar distal radius plate as well as a dorsal spanning bridge plate; R ulnar styloid tip fracture  Per CT on 10/14/21: Impacted, displaced and comminuted intra-articular fracture of the distal radius (This fracture demonstrates up to 2 mm of depression of the distal articular surface anteriorly. There is predominately dorsal displacement at the fracture by up to 0.6 cm. There is a large metaphyseal butterfly fragment which is displaced up to 1.3 cm medially. Nondisplaced fracture extends into the distal radioulnar joint); laterally displaced ulnar styloid fracture; no evidence of carpal bone fracture or dislocation  PRECAUTIONS: Other: No lifting > 5 lbs; immobilization orthosis on other than for hygiene and exercises  WEIGHT BEARING RESTRICTIONS Yes - RUE WBAT  PAIN: Are you  having pain?  Stiffness and soreness of R wrist, particularly when getting up in morning  PLOF: Independent and Vocation/Vocational requirements: Retired  PATIENT GOALS: Return to PLOF   OBJECTIVE:  ------------------------------------------------------------------------------------------------------------------------------------------------------  HAND DOMINANCE: Right  UPPER EXTREMITY ROM: R shoulder and  elbow ROM WFL; hand/finger AROM to be assessed; wrist ROM limited by dorsal spanning bridge plate  Active ROM Right Eval - 7/21 Right 8/16  Wrist flexion 4 4  Wrist extension 28 12  Wrist ulnar deviation unable   Wrist radial deviation unable   Wrist pronation 61 64  Wrist supination 21 63  (Blank rows = not tested)  Active ROM Right 8/2 Right 8/16 Right 8/30  Thumb MCP (0-60) 34 37 41  Thumb IP (0-80) 69 70 76  Thumb Radial abd/add (0-55)  42   Thumb Palmar abd/add (0-45)  42   Thumb Opposition to Small Finger  0   Index MCP (0-90) 51 57 66  Index PIP (0-100)     Index DIP (0-70)      Long MCP (0-90) 64  78 84  Long PIP (0-100)      Long DIP (0-70)      Ring MCP (0-90) 66 79 91  Ring PIP (0-100)      Ring DIP (0-70)      Little MCP (0-90) 78 82 92  Little PIP (0-100)      Little DIP (0-70)      (Blank rows = not tested)  ------------------------------------------------------------------------------------------------------------------------------------------------------  TODAY'S TREATMENT:  Soft Tissue Massage Gentle soft tissue mobilization w/ lotion around incisions on dorsal side of hand and palmar side of distal wrist in order to facilitate mobility within tissue structures and decrease tension; pt tolerated intervention well  PROM OT performed gentle PROM of R composite thumb flexion and composite finger flex/ext of index and long finger, incorporating passive stretch held at end range prn to increase ROM and decrease stiffness and potential adhesions. Pt tolerated exercise w/out discomfort; PROM limited by dorsal spanning bridge plate.    PATIENT EDUCATION: Ongoing condition-specific education related to therapeutic interventions completed this session; discussed POC w/ pt agreeable to plan at this time Person educated: Patient Education method: Explanation Education comprehension: verbalized understanding   HOME EXERCISE PROGRAM: Continue with scar massage;  tendon gliding (printed handout administered);  MedBridge Access Code: [email or web address] URL: [email or web address] Date: 12/18/2021 Prepared by: Merleen Nicely, OTR/L  Program Notes *During each finger exercise, focus on your index and long finger  Exercises - Seated Forearm Pronation and Supination AROM  - 3 x daily - 25 reps - Seated Single Finger Extension  - 3 x daily - 25 reps - Seated Isolated Finger PIP Flexion AROM  - 3 x daily - 25 reps - Seated Finger DIP Flexion AROM with Blocking  - 3 x daily - 25 reps - Seated Thumb IP Flexion AROM with Blocking  - 3 x daily - 25 reps - Seated Finger Composite Flexion Stretch  - 3 x daily - 10 reps - 5-10 sec hold - Thumb PROM Composite Flexion  - 3 x daily - 10 reps - 5-10 sec hold  GOALS: Goals reviewed with patient? No  SHORT TERM GOALS: Target date:  01/01/22       Status:  1 Pt to demonstrate independence w/ wear and care of custom orthosis  Met - 12/02/21  2 Pt will demonstrate independence w/ initial HEP designed for ROM and light strengthening  Met - 12/16/21  3 Pt will be able to  achieve at least 50% of R forearm supination within 6 weeks for improved participation in home maintenance/leisure tasks  Met - 12/16/21    LONG TERM GOALS: Target date:  02/12/22       Status:  1 Pt will increase functional use of R, dominant UE during IADLs as evidenced by improving QuickDASH score from 34% to 10% or better by discharge Baseline: 34% Progressing  2 Pt will improve grip strength in R hand to at least 20 lbs for functional use at home and in IADLs   Progressing  3 Pt will improve AROM in R wrist to at least 35 degrees of ext, to restore functional motion for tasks like reach and grasp Baseline: To be assessed after removal of dorsal bridge plate Progressing  4 Pt to demonstrate independence w/ full HEP designed for increased functional use of R, dominant UE by discharge  Progressing    ASSESSMENT:  CLINICAL IMPRESSION: Pt is  currently 10 weeks s/p ORIF on 10/21/21 for R distal radius fracture w/ secondary small ulnar styloid tip fracture and is doing very well. Session today w/ continued focus on scar massage and review of HEP including AROM and PROM exercises w/ emphasis on forearm supination, tendon excursion, and thumb/finger mobility. Pt has shown improvements in all areas addressed in therapy w/ OT completing reassessment of forearm, wrist, and hand AROM, demonstrating good improvement considering restrictions of dorsal spanning bridge plate. Pt reports minimal functional concerns within restrictions, and continues to be very active w/ report of no pain w/ very mild tightness/soreness. At this time, pt is appropriate to be deferred for continued focus on HEP and then returning to skilled OT after removal of dorsal plate to further address ROM and strength for maximum functional use of R, dominant UE. Pt encouraged to call back before that time with any changes or concerns. Pt verbalized understanding, demonstrates independence w/ current HEP, and is agreeable to plan at this time.  PERFORMANCE DEFICITS in functional skills including dexterity, sensation, edema, ROM, strength, pain, flexibility, decreased knowledge of precautions, and UE functional use.  IMPAIRMENTS are limiting patient from ADLs, IADLs, and leisure.   COMORBIDITIES has no other co-morbidities that affects occupational performance. Patient will benefit from skilled OT to address above impairments and improve overall function.   PLAN: OT FREQUENCY: 1-2x/week  OT DURATION:  12 sessions - pt has completed 4 sessions addressing pain, edema, and ROM as able; pt to go on-hold to transition to HEP and then return to OP OT after dorsal plate removal for ongoing skilled services  PLANNED INTERVENTIONS: self care/ADL training, therapeutic exercise, therapeutic activity, manual therapy, scar mobilization, passive range of motion, splinting, electrical stimulation,  ultrasound, iontophoresis, paraffin, fluidotherapy, moist heat, cryotherapy, contrast bath, and patient/family education  RECOMMENDED OTHER SERVICES: None  CONSULTED AND AGREED WITH PLAN OF CARE: Patient  PLAN FOR NEXT SESSION: Pt to be put on hold after today until removal of dorsal spanning bridge plate (anticipated around mid-September) and subsequent MD clearance to return to therapy   Kathrine Cords, MSOT, OTR/L  12/30/2021, 10:55 AM

## 2022-01-20 ENCOUNTER — Other Ambulatory Visit: Payer: Self-pay

## 2022-01-20 ENCOUNTER — Encounter (HOSPITAL_COMMUNITY): Payer: Self-pay | Admitting: Orthopedic Surgery

## 2022-01-20 NOTE — Progress Notes (Signed)
PCP - Dr Dimas Chyle Cardiologist - n/a  Chest x-ray - n/a EKG - 10/01/21 Stress Test - n/a ECHO - n/a Cardiac Cath - n/a  ICD Pacemaker/Loop - n/a  Sleep Study -  n/a CPAP - none  ERAS: Clear liquids til 6:45 AM DOS.  Anesthesia review: Yes  STOP now taking any Aspirin (unless otherwise instructed by your surgeon), Aleve, Naproxen, Ibuprofen, Motrin, Advil, Goody's, BC's, all herbal medications, fish oil, and all vitamins.   Coronavirus Screening Do you have any of the following symptoms:  Cough yes/no: No Fever (>100.16F)  yes/no: No Runny nose yes/no: No Sore throat yes/no: No Difficulty breathing/shortness of breath  yes/no: No  Have you traveled in the last 14 days and where? yes/no: No  Patient verbalized understanding of instructions that were given via phone

## 2022-01-20 NOTE — Anesthesia Preprocedure Evaluation (Signed)
Anesthesia Evaluation  Patient identified by MRN, date of birth, ID band Patient awake    Reviewed: Allergy & Precautions, NPO status , Patient's Chart, lab work & pertinent test results, reviewed documented beta blocker date and time   Airway Mallampati: II  TM Distance: >3 FB Neck ROM: Full    Dental no notable dental hx. (+) Teeth Intact, Dental Advisory Given   Pulmonary neg pulmonary ROS,    Pulmonary exam normal breath sounds clear to auscultation       Cardiovascular hypertension, Pt. on medications Normal cardiovascular exam+ Valvular Problems/Murmurs  Rhythm:Regular Rate:Normal     Neuro/Psych negative neurological ROS  negative psych ROS   GI/Hepatic Neg liver ROS, GERD  Medicated,  Endo/Other  Hypothyroidism   Renal/GU negative Renal ROS  negative genitourinary   Musculoskeletal S/P ORIF right distal radius Fx   Abdominal   Peds  Hematology  (+) Blood dyscrasia, anemia ,   Anesthesia Other Findings   Reproductive/Obstetrics                                                             Anesthesia Evaluation  Patient identified by MRN, date of birth, ID band Patient awake    Reviewed: Allergy & Precautions, H&P , NPO status , Patient's Chart, lab work & pertinent test results  Airway Mallampati: II  TM Distance: >3 FB Neck ROM: Full    Dental no notable dental hx. (+) Teeth Intact, Dental Advisory Given   Pulmonary neg pulmonary ROS,    Pulmonary exam normal breath sounds clear to auscultation       Cardiovascular hypertension, Pt. on medications  Rhythm:Regular Rate:Normal     Neuro/Psych negative neurological ROS  negative psych ROS   GI/Hepatic Neg liver ROS, GERD  ,  Endo/Other  Hypothyroidism   Renal/GU negative Renal ROS  negative genitourinary   Musculoskeletal   Abdominal   Peds  Hematology  (+) Blood dyscrasia, anemia ,    Anesthesia Other Findings   Reproductive/Obstetrics negative OB ROS                             Anesthesia Physical Anesthesia Plan  ASA: 2  Anesthesia Plan: MAC   Post-op Pain Management: Regional block*, Tylenol PO (pre-op)* and Minimal or no pain anticipated   Induction: Intravenous  PONV Risk Score and Plan: 1 and Propofol infusion, Midazolam and Ondansetron  Airway Management Planned: Natural Airway and Simple Face Mask  Additional Equipment:   Intra-op Plan:   Post-operative Plan:   Informed Consent: I have reviewed the patients History and Physical, chart, labs and discussed the procedure including the risks, benefits and alternatives for the proposed anesthesia with the patient or authorized representative who has indicated his/her understanding and acceptance.     Dental advisory given  Plan Discussed with: CRNA  Anesthesia Plan Comments:         Anesthesia Quick Evaluation  Anesthesia Physical Anesthesia Plan  ASA: 2  Anesthesia Plan: General   Post-op Pain Management: Minimal or no pain anticipated   Induction: Intravenous  PONV Risk Score and Plan: 3 and Treatment may vary due to age or medical condition, Midazolam, Ondansetron and Dexamethasone  Airway Management Planned: LMA  Additional Equipment: None  Intra-op  Plan:   Post-operative Plan: Extubation in OR  Informed Consent: I have reviewed the patients History and Physical, chart, labs and discussed the procedure including the risks, benefits and alternatives for the proposed anesthesia with the patient or authorized representative who has indicated his/her understanding and acceptance.     Dental advisory given  Plan Discussed with: CRNA and Anesthesiologist  Anesthesia Plan Comments: (PAT note written 01/20/2022 by Myra Gianotti, PA-C. )       Anesthesia Quick Evaluation
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
## 2022-01-21 ENCOUNTER — Ambulatory Visit (HOSPITAL_BASED_OUTPATIENT_CLINIC_OR_DEPARTMENT_OTHER): Payer: Self-pay | Admitting: Vascular Surgery

## 2022-01-21 ENCOUNTER — Encounter (HOSPITAL_COMMUNITY)
Admission: RE | Disposition: A | Discharge status: Home / Self Care | Payer: Self-pay | Source: Ambulatory Visit | Attending: Orthopedic Surgery

## 2022-01-21 ENCOUNTER — Ambulatory Visit (HOSPITAL_COMMUNITY)
Admission: RE | Admit: 2022-01-21 | Discharge: 2022-01-21 | Disposition: A | Discharge status: Home / Self Care | Payer: Self-pay | Source: Ambulatory Visit | Attending: Orthopedic Surgery | Admitting: Orthopedic Surgery

## 2022-01-21 ENCOUNTER — Other Ambulatory Visit: Payer: Self-pay

## 2022-01-21 ENCOUNTER — Encounter (HOSPITAL_COMMUNITY): Payer: Self-pay | Admitting: Orthopedic Surgery

## 2022-01-21 ENCOUNTER — Ambulatory Visit (HOSPITAL_COMMUNITY): Payer: Self-pay | Admitting: Vascular Surgery

## 2022-01-21 DIAGNOSIS — W19XXXD Unspecified fall, subsequent encounter: Secondary | ICD-10-CM | POA: Insufficient documentation

## 2022-01-21 DIAGNOSIS — S52571D Other intraarticular fracture of lower end of right radius, subsequent encounter for closed fracture with routine healing: Secondary | ICD-10-CM | POA: Insufficient documentation

## 2022-01-21 DIAGNOSIS — Z4689 Encounter for fitting and adjustment of other specified devices: Secondary | ICD-10-CM | POA: Insufficient documentation

## 2022-01-21 DIAGNOSIS — S52571A Other intraarticular fracture of lower end of right radius, initial encounter for closed fracture: Secondary | ICD-10-CM

## 2022-01-21 DIAGNOSIS — K219 Gastro-esophageal reflux disease without esophagitis: Secondary | ICD-10-CM | POA: Insufficient documentation

## 2022-01-21 DIAGNOSIS — I10 Essential (primary) hypertension: Secondary | ICD-10-CM | POA: Insufficient documentation

## 2022-01-21 DIAGNOSIS — Z79899 Other long term (current) drug therapy: Secondary | ICD-10-CM | POA: Insufficient documentation

## 2022-01-21 DIAGNOSIS — Z79890 Hormone replacement therapy (postmenopausal): Secondary | ICD-10-CM | POA: Insufficient documentation

## 2022-01-21 DIAGNOSIS — E039 Hypothyroidism, unspecified: Secondary | ICD-10-CM | POA: Insufficient documentation

## 2022-01-21 HISTORY — PX: HARDWARE REMOVAL: SHX979

## 2022-01-21 LAB — BASIC METABOLIC PANEL
Anion gap: 8 (ref 5–15)
BUN: 14 mg/dL (ref 6–20)
CO2: 26 mmol/L (ref 22–32)
Calcium: 9.3 mg/dL (ref 8.9–10.3)
Chloride: 102 mmol/L (ref 98–111)
Creatinine, Ser: 0.92 mg/dL (ref 0.61–1.24)
GFR, Estimated: >60 mL/min (ref >60)
Glucose, Bld: 104 mg/dL — ABNORMAL HIGH (ref 70–99)
Potassium: 4.3 mmol/L (ref 3.5–5.1)
Sodium: 136 mmol/L (ref 135–145)

## 2022-01-21 LAB — CBC
HCT: 39.1 % (ref 39.0–52.0)
Hemoglobin: 12.9 g/dL — ABNORMAL LOW (ref 13.0–17.0)
MCH: 31.5 pg (ref 26.0–34.0)
MCHC: 33 g/dL (ref 30.0–36.0)
MCV: 95.4 fL (ref 80.0–100.0)
Platelets: 330 10*3/uL (ref 150–400)
RBC: 4.1 MIL/uL — ABNORMAL LOW (ref 4.22–5.81)
RDW: 13.1 % (ref 11.5–15.5)
WBC: 7 10*3/uL (ref 4.0–10.5)
nRBC: 0 % (ref 0.0–0.2)

## 2022-01-21 SURGERY — REMOVAL, HARDWARE
Anesthesia: General | Laterality: Right

## 2022-01-21 MED ORDER — FENTANYL CITRATE (PF) 100 MCG/2ML IJ SOLN
INTRAMUSCULAR | Status: AC
  Filled 2022-01-21: qty 2

## 2022-01-21 MED ORDER — ONDANSETRON HCL 4 MG/2ML IJ SOLN
INTRAMUSCULAR | Status: AC
  Filled 2022-01-21: qty 2

## 2022-01-21 MED ORDER — MIDAZOLAM HCL 2 MG/2ML IJ SOLN
INTRAMUSCULAR | Status: AC
  Filled 2022-01-21: qty 2

## 2022-01-21 MED ORDER — LIDOCAINE 2% (20 MG/ML) 5 ML SYRINGE
INTRAMUSCULAR | Status: AC
  Filled 2022-01-21: qty 5

## 2022-01-21 MED ORDER — OXYCODONE HCL 5 MG/5ML PO SOLN: 5.0000 mg | Freq: Once | ORAL | Status: DC | PRN

## 2022-01-21 MED ORDER — DEXAMETHASONE SODIUM PHOSPHATE 10 MG/ML IJ SOLN
INTRAMUSCULAR | Status: DC | PRN
  Administered 2022-01-21: 10 mg via INTRAVENOUS

## 2022-01-21 MED ORDER — PHENYLEPHRINE 80 MCG/ML (10ML) SYRINGE FOR IV PUSH (FOR BLOOD PRESSURE SUPPORT)
PREFILLED_SYRINGE | INTRAVENOUS | Status: DC | PRN
  Administered 2022-01-21 (×2): 80 ug via INTRAVENOUS

## 2022-01-21 MED ORDER — LACTATED RINGERS IV SOLN: INTRAVENOUS | Status: DC

## 2022-01-21 MED ORDER — CEFAZOLIN SODIUM-DEXTROSE 2-4 GM/100ML-% IV SOLN
2.0000 g | INTRAVENOUS | Status: AC
  Administered 2022-01-21: 2 g via INTRAVENOUS
  Filled 2022-01-21: qty 100

## 2022-01-21 MED ORDER — ONDANSETRON HCL 4 MG/2ML IJ SOLN
INTRAMUSCULAR | Status: DC | PRN
  Administered 2022-01-21: 4 mg via INTRAVENOUS

## 2022-01-21 MED ORDER — FENTANYL CITRATE (PF) 250 MCG/5ML IJ SOLN
INTRAMUSCULAR | Status: DC | PRN
  Administered 2022-01-21 (×2): 25 ug via INTRAVENOUS
  Administered 2022-01-21: 50 ug via INTRAVENOUS

## 2022-01-21 MED ORDER — FENTANYL CITRATE (PF) 250 MCG/5ML IJ SOLN
INTRAMUSCULAR | Status: AC
  Filled 2022-01-21: qty 5

## 2022-01-21 MED ORDER — ORAL CARE MOUTH RINSE: 15.0000 mL | Freq: Once | OROMUCOSAL | Status: AC

## 2022-01-21 MED ORDER — CHLORHEXIDINE GLUCONATE 0.12 % MT SOLN
15.0000 mL | Freq: Once | OROMUCOSAL | Status: AC
  Administered 2022-01-21: 15 mL via OROMUCOSAL
  Filled 2022-01-21: qty 15

## 2022-01-21 MED ORDER — LIDOCAINE 2% (20 MG/ML) 5 ML SYRINGE
INTRAMUSCULAR | Status: DC | PRN
  Administered 2022-01-21: 80 mg via INTRAVENOUS

## 2022-01-21 MED ORDER — BUPIVACAINE HCL (PF) 0.25 % IJ SOLN
INTRAMUSCULAR | Status: AC
  Filled 2022-01-21: qty 30

## 2022-01-21 MED ORDER — ROCURONIUM BROMIDE 10 MG/ML (PF) SYRINGE
PREFILLED_SYRINGE | INTRAVENOUS | Status: AC
  Filled 2022-01-21: qty 10

## 2022-01-21 MED ORDER — 0.9 % SODIUM CHLORIDE (POUR BTL) OPTIME
TOPICAL | Status: DC | PRN
  Administered 2022-01-21: 1000 mL

## 2022-01-21 MED ORDER — OXYCODONE HCL 5 MG PO TABS: 5.0000 mg | ORAL_TABLET | Freq: Once | ORAL | Status: DC | PRN

## 2022-01-21 MED ORDER — ONDANSETRON HCL 4 MG/2ML IJ SOLN: 4.0000 mg | Freq: Once | INTRAMUSCULAR | Status: DC | PRN

## 2022-01-21 MED ORDER — BUPIVACAINE HCL (PF) 0.25 % IJ SOLN
INTRAMUSCULAR | Status: DC | PRN
  Administered 2022-01-21: 23 mL

## 2022-01-21 MED ORDER — FENTANYL CITRATE (PF) 100 MCG/2ML IJ SOLN
25.0000 ug | INTRAMUSCULAR | Status: DC | PRN
  Administered 2022-01-21: 50 ug via INTRAVENOUS

## 2022-01-21 MED ORDER — PROPOFOL 10 MG/ML IV BOLUS
INTRAVENOUS | Status: AC
  Filled 2022-01-21: qty 20

## 2022-01-21 MED ORDER — DEXAMETHASONE SODIUM PHOSPHATE 10 MG/ML IJ SOLN
INTRAMUSCULAR | Status: AC
  Filled 2022-01-21: qty 1

## 2022-01-21 MED ORDER — PROPOFOL 10 MG/ML IV BOLUS
INTRAVENOUS | Status: DC | PRN
  Administered 2022-01-21: 180 mg via INTRAVENOUS
  Administered 2022-01-21: 20 mg via INTRAVENOUS

## 2022-01-21 MED ORDER — MIDAZOLAM HCL 2 MG/2ML IJ SOLN
INTRAMUSCULAR | Status: DC | PRN
  Administered 2022-01-21: 2 mg via INTRAVENOUS

## 2022-01-21 SURGICAL SUPPLY — 36 items
BAG COUNTER SPONGE SURGICOUNT (BAG) ×1 IMPLANT
BLADE CLIPPER SURG (BLADE) IMPLANT
BNDG ELASTIC 3X5.8 VLCR STR LF (GAUZE/BANDAGES/DRESSINGS) ×1 IMPLANT
BNDG ELASTIC 4X5.8 VLCR STR LF (GAUZE/BANDAGES/DRESSINGS) ×1 IMPLANT
BNDG ESMARK 4X9 LF (GAUZE/BANDAGES/DRESSINGS) ×1 IMPLANT
BNDG GAUZE DERMACEA FLUFF 4 (GAUZE/BANDAGES/DRESSINGS) ×1 IMPLANT
CORD BIPOLAR FORCEPS 12FT (ELECTRODE) ×1 IMPLANT
COVER SURGICAL LIGHT HANDLE (MISCELLANEOUS) ×1 IMPLANT
CUFF TOURN SGL QUICK 18X4 (TOURNIQUET CUFF) ×1 IMPLANT
CUFF TOURN SGL QUICK 24 (TOURNIQUET CUFF)
CUFF TRNQT CYL 24X4X16.5-23 (TOURNIQUET CUFF) IMPLANT
DRAPE OEC MINIVIEW 54X84 (DRAPES) IMPLANT
DRSG XEROFORM 1X8 (GAUZE/BANDAGES/DRESSINGS) IMPLANT
ELECT REM PT RETURN 9FT ADLT (ELECTROSURGICAL)
ELECTRODE REM PT RTRN 9FT ADLT (ELECTROSURGICAL) IMPLANT
GAUZE 4X4 16PLY ~~LOC~~+RFID DBL (SPONGE) ×1 IMPLANT
GAUZE SPONGE 4X4 12PLY STRL (GAUZE/BANDAGES/DRESSINGS) ×1 IMPLANT
GLOVE BIO SURGEON STRL SZ7 (GLOVE) IMPLANT
GOWN STRL REUS W/ TWL LRG LVL3 (GOWN DISPOSABLE) ×3 IMPLANT
GOWN STRL REUS W/TWL LRG LVL3 (GOWN DISPOSABLE) ×3
KIT BASIN OR (CUSTOM PROCEDURE TRAY) ×1 IMPLANT
KIT TURNOVER KIT B (KITS) ×1 IMPLANT
NS IRRIG 1000ML POUR BTL (IV SOLUTION) ×1 IMPLANT
PACK ORTHO EXTREMITY (CUSTOM PROCEDURE TRAY) ×1 IMPLANT
PAD ARMBOARD 7.5X6 YLW CONV (MISCELLANEOUS) ×2 IMPLANT
PAD CAST 4YDX4 CTTN HI CHSV (CAST SUPPLIES) ×1 IMPLANT
PADDING CAST COTTON 4X4 STRL (CAST SUPPLIES) ×1
SUCTION FRAZIER HANDLE 10FR (MISCELLANEOUS) ×1
SUCTION TUBE FRAZIER 10FR DISP (MISCELLANEOUS) IMPLANT
SUT ETHILON 4 0 PS 2 18 (SUTURE) IMPLANT
SUT MNCRL AB 4-0 PS2 18 (SUTURE) IMPLANT
SYR CONTROL 10ML LL (SYRINGE) IMPLANT
TOWEL GREEN STERILE (TOWEL DISPOSABLE) ×1 IMPLANT
TOWEL GREEN STERILE FF (TOWEL DISPOSABLE) ×1 IMPLANT
TUBE CONNECTING 12X1/4 (SUCTIONS) IMPLANT
WATER STERILE IRR 1000ML POUR (IV SOLUTION) ×1 IMPLANT

## 2022-01-21 NOTE — Transfer of Care (Signed)
Immediate Anesthesia Transfer of Care Note  Patient: Wyvonna Plum  Procedure(s) Performed: HARDWARE REMOVAL WRIST (Right)  Patient Location: PACU  Anesthesia Type:General  Level of Consciousness: awake and drowsy  Airway & Oxygen Therapy: Patient Spontanous Breathing  Post-op Assessment: Report given to RN and Post -op Vital signs reviewed and stable  Post vital signs: Reviewed and stable  Last Vitals:  Vitals Value Taken Time  BP 145/93 01/21/22 1146  Temp    Pulse 87 01/21/22 1147  Resp 10 01/21/22 1147  SpO2 98 % 01/21/22 1147  Vitals shown include unvalidated device data.  Last Pain:  Vitals:   01/21/22 0814  TempSrc:   PainSc: 0-No pain         Complications: No notable events documented.

## 2022-01-21 NOTE — H&P (Signed)
HAND SURGERY    HPI: Richard Conrad is a 61 y.o. male who presents with a closed, severely comminuted right distal radius fracture now status post volar locked plating and dorsal bridge plating.  He presents today for plate removal.  Has been working hard with therapy and has near full pronation and supination of the forearm.  He is able to make a complete fist.  He denies any new medical issues or symptoms since his last outpatient visit last week.  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" (03/23/2016)   History of kidney stones    "passed them"   Hypertension    Hypothyroidism    Iron deficiency anemia    Seasonal allergies    "fall and spring; mild" (03/23/2016)   Past Surgical History:  Procedure Laterality Date   APPENDECTOMY  03/23/2016   COLON RESECTION N/A 03/23/2016   Procedure: LAPAROSCOPIC CECECTOMY;  Surgeon: Coralie Keens, MD;  Location: [place];  Service: General;  Laterality: N/A;   COLON SURGERY  03/23/2016   LAPAROSCOPIC CECECTOMY     COLONOSCOPY     ESOPHAGOGASTRODUODENOSCOPY ENDOSCOPY     OPEN REDUCTION INTERNAL FIXATION (ORIF) DISTAL RADIAL FRACTURE Right 10/21/2021   Procedure: RIGHT OPEN REDUCTION INTERNAL FIXATION (ORIF) DISTAL RADIAL FRACTURE;  Surgeon: Sherilyn Cooter, MD;  Location: [place];  Service: Orthopedics;  Laterality: Right;   RADIAL ARTERY ANEURYSM REPAIR  2019   TONSILLECTOMY     VASECTOMY     Social History   Socioeconomic History   Marital status: Married    Spouse name: Not on file   Number of children: Not on file   Years of education: Not on file   Highest education level: Not on file  Occupational History   Not on file  Tobacco Use   Smoking status: Never   Smokeless tobacco: Never  Vaping Use   Vaping Use: Never used  Substance and Sexual Activity   Alcohol use: No    Alcohol/week: 0.0 standard drinks of  alcohol   Drug use: No   Sexual activity: Yes  Other Topics Concern   Not on file  Social History Narrative   Not on file   Social Determinants of Health   Financial Resource Strain: Not on file  Food Insecurity: Not on file  Transportation Needs: Not on file  Physical Activity: Not on file  Stress: Not on file  Social Connections: Not on file   Family History  Problem Relation Age of Onset   Cancer Mother    Hyperlipidemia Mother    Cancer Father    Hyperlipidemia Father    - negative except otherwise stated in the family history section Allergies  Allergen Reactions   Ciprofloxacin Anxiety   Doxycycline Itching   Prior to Admission medications   Medication Sig Start Date End Date Taking? Authorizing Provider  amLODipine (NORVASC) 5 MG tablet Take 1 tablet (5 mg total) by mouth daily. Patient taking differently: Take 5 mg by mouth at bedtime. 04/07/21  Yes Vivi Barrack, MD  Ascorbic Acid (VITAMIN C PO) Take 1 tablet by mouth daily as needed (immune support).   Yes [provider]  Cholecalciferol (DIALYVITE VITAMIN D 5000) 125 MCG (5000 UT) capsule Take 5,000 Units by mouth daily.   Yes [provider]  levothyroxine (SYNTHROID) 75 MCG tablet TAKE 1 TABLET ON MONDAY, [place],  THURSDAY , [place]. AND TAKE 1 AND 1/2 [facility] AND SUNDAY Patient taking differently: Take 75-112.5 mcg by mouth See admin instructions. TAKE 75 MCG ON MONDAY, TUESDAY, THURSDAY , [place]. AND TAKE 112.5 MCG ON [facility] AND SUNDAY 04/07/21  Yes Vivi Barrack, MD  magnesium oxide (MAG-OX) 400 (240 Mg) MG tablet Take 400 mg by mouth daily as needed (Immune Support).   Yes [provider]  Multiple Vitamins-Minerals (MULTIVITAMIN MEN PO) Take 1 tablet by mouth daily.   Yes [provider]  Carboxymethylcellul-Glycerin (LUBRICATING EYE DROPS OP) Place 1 drop into both eyes daily as needed (dry eyes).    [provider]  cetirizine  (ZYRTEC) 10 MG tablet Take 10 mg by mouth daily as needed for allergies.    [provider]   No results found. - Positive ROS: All other systems have been reviewed and were otherwise negative with the exception of those mentioned in the HPI and as above.  Physical Exam: General: No acute distress, resting comfortably Cardiovascular: BUE warm and well perfused, normal rate Respiratory: Normal WOB on RA Skin: Warm and dry Neurologic: Sensation intact distally Psychiatric: Patient is at baseline mood and affect  Right Upper Extremity  Incisions clean, dry, and well-healed.  Near full pronation and supination of the forearm. To make a full and complete fist and fully extend all fingers. Patient intact light touch throughout the hand. Hand warm and well-perfused with brisk capillary refill.   Assessment: 61 year old male with closed, comminuted, right distal radius fracture status post volar locked plating and dorsal bridge plating presents today for hardware removal.  Plan: OR today for removal of dorsal spanning plate and tenolysis of extensor tendons. Patient to be discharged to home today from the OR. The patient back again in 10 to 14 days for his first postop visit We will start therapy as soon as possible to work on wrist range of motion.  Thank you for the consult and the opportunity to see [name], M.D. [facility] 9:26 AM

## 2022-01-21 NOTE — Interval H&P Note (Signed)
History and Physical Interval Note:  01/21/2022 9:29 AM  Richard Conrad  has presented today for surgery, with the diagnosis of right distal raduis fracture.  The various methods of treatment have been discussed with the patient and family. After consideration of risks, benefits and other options for treatment, the patient has consented to  Procedure(s) with comments: HARDWARE REMOVAL WRIST (Right) - 61 as a surgical intervention.  The patient's history has been reviewed, patient examined, no change in status, stable for surgery.  I have reviewed the patient's chart and labs.  Questions were answered to the patient's satisfaction.     Dakia Schifano Sariah Henkin

## 2022-01-21 NOTE — Anesthesia Postprocedure Evaluation (Signed)
Anesthesia Post Note  Patient: Richard Conrad  Procedure(s) Performed: HARDWARE REMOVAL WRIST (Right)     Patient location during evaluation: PACU Anesthesia Type: General Level of consciousness: awake and alert and oriented Pain management: pain level controlled Vital Signs Assessment: post-procedure vital signs reviewed and stable Respiratory status: spontaneous breathing, nonlabored ventilation and respiratory function stable Cardiovascular status: blood pressure returned to baseline and stable Postop Assessment: no apparent nausea or vomiting Anesthetic complications: no   No notable events documented.  Last Vitals:  Vitals:   01/21/22 1145 01/21/22 1200  BP: (!) 145/93 (!) 157/95  Pulse: (!) 104 77  Resp: 18 14  Temp: (!) 36 C   SpO2: 100% 99%    Last Pain:  Vitals:   01/21/22 1145  TempSrc:   PainSc: Asleep                 Elvan Ebron A.

## 2022-01-21 NOTE — OR Nursing (Signed)
Implanted plate removed, as noted on Intra-Op record. Blue colored screws (x5) and gold colored screws (x2) were also removed, model numbers unknown.

## 2022-01-21 NOTE — Anesthesia Procedure Notes (Signed)
Procedure Name: LMA Insertion Date/Time: 01/21/2022 10:40 AM  Performed by: Dorann Lodge, CRNAPre-anesthesia Checklist: Patient identified, Emergency Drugs available, Suction available and Patient being monitored Patient Re-evaluated:Patient Re-evaluated prior to induction Oxygen Delivery Method: Circle System Utilized Preoxygenation: Pre-oxygenation with 100% oxygen Induction Type: IV induction Ventilation: Mask ventilation without difficulty LMA: LMA inserted LMA Size: 4.0 Number of attempts: 1 Airway Equipment and Method: Bite block Placement Confirmation: positive ETCO2 Tube secured with: Tape Dental Injury: Teeth and Oropharynx as per pre-operative assessment

## 2022-01-21 NOTE — Discharge Instructions (Signed)
Richard Conrad, M.D. Hand Surgery  POST-OPERATIVE DISCHARGE INSTRUCTIONS   PRESCRIPTIONS: - You have been given a prescription to be taken as directed for post-operative pain control.  You may also take over the counter ibuprofen/aleve and tylenol for pain. Take this as directed on the packaging. Do not exceed 3000 mg tylenol/acetaminophen in 24 hours.  Ibuprofen 600-800 mg (3-4) tablets by mouth every 6 hours as needed for pain.   OR  Aleve 2 tablets by mouth every 12 hours (twice daily) as needed for pain.   AND/OR  Tylenol 1000 mg (2 tablets) every 8 hours as needed for pain.  - Please use your pain medication carefully, as refills are limited and you may not be provided with one.  As stated above, please use over the counter pain medicine - it will also be helpful with decreasing your swelling.    ANESTHESIA: -After your surgery, post-surgical discomfort or pain is likely. This discomfort can last several days to a few weeks. At certain times of the day your discomfort may be more intense.   Did you receive a nerve block?   - A nerve block can provide pain relief for one hour to two days after your surgery. As long as the nerve block is working, you will experience little or no sensation in the area the surgeon operated on.  - As the nerve block wears off, you will begin to experience pain or discomfort. It is very important that you begin taking your prescribed pain medication before the nerve block fully wears off. Treating your pain at the first sign of the block wearing off will ensure your pain is better controlled and more tolerable when full-sensation returns. Do not wait until the pain is intolerable, as the medicine will be less effective. It is better to treat pain in advance than to try and catch up.   General Anesthesia:  If you did not receive a nerve block during your surgery, you will need to start taking your pain medication shortly after your surgery and  should continue to do so as prescribed by your surgeon.     ICE AND ELEVATION: - You may use ice for the first 48-72 hours, but it is not critical.   - Motion of your fingers is very important to decrease the swelling.  - Elevation, as much as possible for the next 48 hours, is critical for decreasing swelling as well as for pain relief. Elevation means when you are seated or lying down, you hand should be at or above your heart. When walking, the hand needs to be at or above the level of your elbow.  - If the bandage gets too tight, it may need to be loosened. Please contact our office and we will instruct you in how to do this.    SURGICAL BANDAGES:  - Keep your dressing and/or splint clean and dry at all times.  You can remove your dressing 7 days from now and change with a dry dressing or Band-Aids as needed thereafter. - You may place a plastic bag over your bandage to shower, but be careful, do not get your bandages wet.  - After the bandages have been removed, it is OK to get the stitches wet in a shower or with hand washing. Do Not soak or submerge the wound yet. Please do not use lotions or creams on the stitches.      HAND THERAPY:  - You may not need any. If you do,  we will begin this at your follow up visit in the clinic.    ACTIVITY AND WORK: - You are encouraged to move any fingers which are not in the bandage.  - Light use of the fingers is allowed to assist the other hand with daily hygiene and eating, but strong gripping or lifting is often uncomfortable and should be avoided.     [facility] [address] [place],  [place]  [id number] [phone]

## 2022-01-21 NOTE — Op Note (Addendum)
Date of Surgery: 01/21/2022  INDICATIONS: Patient is a 61 y.o.-year-old male with a severely comminuted right distal radius fracture after a fall approximately 12 weeks ago.  He presents today for dorsal bridge plate removal.  Risks, benefits, and alternatives to surgery were again discussed with the patient in the preoperative area. The patient wishes to proceed with surgery.  Informed consent was signed after our discussion.   PREOPERATIVE DIAGNOSIS:  Closed, right intra-articular distal radius fracture, 3+ fragments Retained orthopedic hardware  POSTOPERATIVE DIAGNOSIS: Same.  PROCEDURE:  Removal of deep implant (20680) Extensor tenolysis ECRB tendon (39514) Extensor tenolysis ECRL tendon (63980) Extensor tenolysis EDC tendon to index finger (81454) Extensor tenolysis EIP tendon (62012)   SURGEON: Waylan Rocher, M.D.  ASSIST:   ANESTHESIA:  general  IV FLUIDS AND URINE: See anesthesia.  ESTIMATED BLOOD LOSS: 5 mL.  IMPLANTS:  Implant Name Type Inv. Item Serial No. Manufacturer Lot No. LRB No. Used Action  PLATE DORSAL SHT SPAN GEM 210 - [id number] Plate PLATE DORSAL SHT SPAN GEM 210  SKELETAL DYNAMICS  Right 1 Explanted     DRAINS: None  COMPLICATIONS: None  DESCRIPTION OF PROCEDURE: The patient was met in the preoperative holding area where the surgical site was marked and the consent form was verified.  The patient was then taken to the operating room and transferred to the operating table.  All bony prominences were well padded.  A tourniquet was applied to the right upper arm.  General endotracheal anesthesia was induced.  The operative extremity was prepped and draped in the usual and sterile fashion.  A formal time-out was performed to confirm that this was the correct patient, surgery, side, and site.   Following formal timeout, the limb was gently exsanguinated with an Esmarch bandage and the tourniquet inflated 250 mmHg.  I began by opening the proximal  incision.  The skin was incised.  Blunt dissection was used to identify the interval adjacent to the extensor carpi radialis longus and brevis tendons.  The plate was identified.  A 15 blade and Freer elevator was used to debride scar tissue from the plate.  The 3 screws were removed uneventfully.  I then turned my attention towards the distal incision.  The skin was incised.  Blunt dissection was used to identify the EDC to the index finger as well as the EIP tendons.  The plate was identified.  A 15 blade and a Freer elevator was used to debride any scar tissue from over top of the plate.  The screws and the metacarpal were removed uneventfully.  The plate was easily removed in an antegrade fashion.  I used a pickup and a tenotomy scissor to sharply debride all scar tissue from around the EIP and EDC tendons performing a tenolysis.  I then returned the proximal wound and performed a similar tenolysis of the ECRL and ECRB tendons using a pickup and tenotomy scissor.  Fluoroscopy was brought in and used to confirm that the plate and screws had all been removed.  The fracture was well-healed.  The volar plate was in appropriate position.  At this point, the wounds were all thoroughly irrigated with copious sterile saline.  They were closed using a 4-0 nylon suture in horizontal mattress fashion.  The tourniquet was let down.  Hemostasis was achieved with direct pressure over the wounds.  The fingers are all warm, pink, and well-perfused at the end of the procedure.  The patient was then extubated uneventfully and transferred to the  postoperative bed.  All counts were correct x2 at the procedure.  The patient was then taken the PACU in stable condition.   POSTOPERATIVE PLAN: We will be discharged home with appropriate pain medication and discharge instructions.  See him back in the office in 10 to 14 days for his first postop visit.  He will return to therapy to work on wrist range of motion.  Audria Nine, MD 11:45 AM

## 2022-01-22 ENCOUNTER — Encounter (HOSPITAL_COMMUNITY): Payer: Self-pay | Admitting: Orthopedic Surgery

## 2022-01-25 ENCOUNTER — Encounter: Payer: Self-pay | Admitting: *Deleted

## 2022-01-27 ENCOUNTER — Ambulatory Visit: Payer: Self-pay | Attending: Orthopedic Surgery | Admitting: Occupational Therapy

## 2022-01-27 DIAGNOSIS — M6281 Muscle weakness (generalized): Secondary | ICD-10-CM | POA: Insufficient documentation

## 2022-01-27 DIAGNOSIS — M25631 Stiffness of right wrist, not elsewhere classified: Secondary | ICD-10-CM | POA: Insufficient documentation

## 2022-01-27 DIAGNOSIS — R600 Localized edema: Secondary | ICD-10-CM | POA: Insufficient documentation

## 2022-01-27 DIAGNOSIS — M25531 Pain in right wrist: Secondary | ICD-10-CM | POA: Insufficient documentation

## 2022-01-27 NOTE — Therapy (Signed)
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" (03/23/2016)   History of kidney stones    "passed them"   Hypertension    Hypothyroidism    Iron deficiency anemia    Seasonal allergies    "fall and spring; mild" (03/23/2016)   Past Surgical History:  Procedure Laterality Date   APPENDECTOMY  03/23/2016   COLON RESECTION N/A 03/23/2016   Procedure: LAPAROSCOPIC CECECTOMY;  Surgeon: Coralie Keens, MD;  Location: [place];  Service: General;  Laterality: N/A;   COLON SURGERY  03/23/2016   LAPAROSCOPIC CECECTOMY     COLONOSCOPY     ESOPHAGOGASTRODUODENOSCOPY ENDOSCOPY     HARDWARE REMOVAL Right 01/21/2022   Procedure: HARDWARE REMOVAL WRIST;  Surgeon: Sherilyn Cooter, MD;  Location: [place];  Service: Orthopedics;  Laterality: Right;  61   OPEN REDUCTION INTERNAL FIXATION (ORIF) DISTAL RADIAL FRACTURE Right 10/21/2021   Procedure: RIGHT OPEN REDUCTION INTERNAL FIXATION (ORIF) DISTAL RADIAL FRACTURE;  Surgeon: Sherilyn Cooter, MD;  Location: [place];  Service: Orthopedics;  Laterality: Right;   RADIAL ARTERY ANEURYSM REPAIR  2019   TONSILLECTOMY     VASECTOMY     Patient Active Problem List   Diagnosis Date Noted   Closed fracture of right distal radius 10/13/2021   Dyslipidemia 04/08/2021   Hyperglycemia 04/08/2021   Benign essential HTN 04/11/2020   Hypothyroidism (acquired) 04/11/2020   Gastroesophageal reflux disease without esophagitis 12/06/2017   Seasonal allergic rhinitis 12/06/2017   Colon polyp, hyperplastic 03/23/2016    ONSET DATE: DOS: 10/21/21, DOI: 10/11/21  REFERRING DIAG: E17.408X (ICD-10-CM) - Other closed intra-articular fracture of  distal end of right radius, initial encounter   THERAPY DIAG:  Pain in right wrist  Stiffness of right wrist, not elsewhere classified  Localized edema  Muscle weakness (generalized)  Rationale for Evaluation and Treatment Rehabilitation  SUBJECTIVE:   SUBJECTIVE STATEMENT: Pt arrives to OP OT evaluation after R wrist fracture on 10/11/21 when pt reports he fell from his truck. Surgery on 10/21/21. States he has not yet started therapy because he just got back from getting married in [place] and is also working on closing on a new house this week. Reports he has not been having too much pain, managing well w/ OTC Tylenol/Advil, and has been able to lightly help work on a deck, primarily using his LUE. Pt accompanied by: self  PERTINENT HISTORY: comminuted intra-articular R distal radius fracture s/p ORIF 10/21/21 w/ volar distal radius plate as well as a dorsal spanning bridge plate; R ulnar styloid tip fracture  Per CT on 10/14/21: Impacted, displaced and comminuted intra-articular fracture of the distal radius (This fracture demonstrates up to 2 mm of depression of the distal articular surface anteriorly. There is predominately dorsal displacement at the fracture by up to 0.6 cm. There is a large metaphyseal butterfly fragment which is displaced up to 1.3 cm medially. Nondisplaced fracture extends into the distal radioulnar joint); laterally displaced ulnar styloid fracture; no evidence of carpal bone fracture or dislocation  PRECAUTIONS: Other: No lifting greater than 2-5 lbs; immobilization orthosis on at all times other than for hygiene and exercises  WEIGHT BEARING RESTRICTIONS Yes - NWB RUE  PAIN:  Are you having pain? Yes: NPRS scale: 1/10 Pain location: dorsal side of MCPs Pain description: stiffness, numbness Aggravating factors: movement Relieving factors: ice  FALLS: Has patient fallen in last 6 months? Yes. Number of falls - 1 (onset)  LIVING ENVIRONMENT: Lives with:  lives with their spouse Lives in: House/apartment Stairs: No Has following equipment at home: None  PLOF: Independent and Vocation/Vocational requirements: Retired  PATIENT GOALS: Return to [employer]  OBJECTIVE:   HAND DOMINANCE: Right  ADLs: Overall ADLs: Able to complete BADLs w/ Mod Ind  FUNCTIONAL OUTCOME MEASURES: Quick Dash: 34/100  UPPER EXTREMITY ROM: R shoulder and elbow ROM WFL; hand/finger AROM to be assessed  Active ROM Right eval Left  Right 01/27/22  Shoulder flexion     Shoulder abduction     Shoulder adduction     Shoulder extension     Shoulder internal rotation     Shoulder external rotation     Elbow flexion     Elbow extension     Wrist flexion 4 74 17  Wrist extension 28 76 24  Wrist ulnar deviation  46 9  Wrist radial deviation  30 9  Wrist pronation 61    Wrist supination 21    (Blank rows = not tested)  Active ROM Left 01/27/22 Right eval  Thumb MCP (0-60) 60 46  Thumb IP (0-80)    Thumb Radial abd/add (0-55) 59 44  Thumb Palmar abd/add (0-45) 46 60  Thumb Opposition to Small Finger    Index MCP (0-90) 89 46  Index PIP (0-100)    Index DIP (0-70)     Long MCP (0-90)     Long PIP (0-100)     Long DIP (0-70)     Ring MCP (0-90)     Ring PIP (0-100)     Ring DIP (0-70)     Little MCP (0-90)     Little PIP (0-100)     Little DIP (0-70)     (Blank rows = not tested)   UPPER EXTREMITY MMT: Unable to assess due to medical restrictions  HAND FUNCTION: Unable to assess due to medical restrictions   COORDINATION: Able to oppose thumb to each finger w/ min difficulty opposing to small finger  SENSATION: WFL; reports some numbness along dorsal aspect of MCPJs  EDEMA: Measured circumferentially 5 cm from distal wrist crease; R elbow: 17.4 cm  L elbow 15.9 cm  COGNITION: Overall cognitive status: Within functional limits for tasks assessed   TODAY'S TREATMENT - 11/20/21:  Wrist flexion/extension Radial/ulnar  deviation   PATIENT EDUCATION: Educated on role and purpose of OT related to pt diagnosis. Introduced gentle scar massage, mid-range forearm AROM, and provided condition-specific education, answering pt questions prn. Person educated: Patient Education method: Customer service manager Education comprehension: verbalized understanding   HOME EXERCISE PROGRAM: Scar massage   GOALS: Goals reviewed with patient? No  SHORT TERM GOALS: Target date:  01/01/22       Status:  1 Pt to demonstrate independence w/ wear and care of custom orthosis  Met  2 Pt will demonstrate independence w/ initial HEP designed for ROM and light strengthening  Initial  3 Pt will be able to achieve at least 50% of R forearm supination within 6 weeks for improved participation in home maintenance/leisure tasks  Initial    LONG TERM GOALS: Target date:  02/12/22       Status:  1 Pt will increase functional use of R, dominant UE during IADLs as  evidenced by improving QuickDASH score from 34% to 10% or better by discharge Baseline: 34% Initial  2 Pt will improve grip strength in R hand to at least 20 lbs for functional use at home and in IADLs   Initial  3 Pt will improve AROM in R wrist to at least 35 degrees of ext, to restore functional motion for tasks like reach and grasp Baseline: To be assessed after removal of dorsal bridge plate Initial  4 Pt to demonstrate independence w/ full HEP designed for increased functional use of R, dominant UE by discharge  Initial    ASSESSMENT:  CLINICAL IMPRESSION: Pt is a 61 y/o who presents to OP OT a little over 4 weeks post-op from ORIF w/ Dr. Tempie Donning on 10/21/21, referred for fabrication of custom-fit wrist immobilization orthosis to prevent movement, protect, and facilitate healing for several weeks. Bi-valve, clamshell orthosis design selected to provide maximum protection considering pt's activity level and impacted structures. Pt was not billed for time  spent during resting periods or waiting for materials to be available for fitting/adjustment. AROM and strength is still restricted per protocol and pt reports minimal pain; ROM of wrist extension largely limited by dorsal spanning bridge plate. Pt will benefit from skilled occupational therapy services to address decreased ROM, strength, pain, and implementation and guided progression of HEP per [facility] protocol to improve participation and safety during all ADLs and ensure maximal function use of R, dominant UE.  PERFORMANCE DEFICITS in functional skills including dexterity, sensation, edema, ROM, strength, pain, flexibility, decreased knowledge of precautions, and UE functional use.  IMPAIRMENTS are limiting patient from ADLs, IADLs, and leisure.   COMORBIDITIES has no other co-morbidities that affects occupational performance. Patient will benefit from skilled OT to address above impairments and improve overall function.  MODIFICATION OR ASSISTANCE TO COMPLETE EVALUATION: No modification of tasks or assist necessary to complete an evaluation.  OT OCCUPATIONAL PROFILE AND HISTORY: Problem focused assessment: Including review of records relating to presenting problem.  CLINICAL DECISION MAKING: Moderate - several treatment options, min-mod task modification necessary  REHAB POTENTIAL: Excellent  EVALUATION COMPLEXITY: Low   PLAN: OT FREQUENCY: 1-2x/week  OT DURATION:  12 sessions - will plan to complete 4-6 sessions to address pain, edema, and ROM as able; pt will then likely go on-hold to transition to HEP and then return to OP OT a few weeks after dorsal plate removal for ongoing skilled services  PLANNED INTERVENTIONS: self care/ADL training, therapeutic exercise, therapeutic activity, manual therapy, scar mobilization, passive range of motion, splinting, electrical stimulation, ultrasound, iontophoresis, paraffin, fluidotherapy, moist heat, cryotherapy, contrast bath, and  patient/family education  RECOMMENDED OTHER SERVICES: None  CONSULTED AND AGREED WITH PLAN OF CARE: Patient  PLAN FOR NEXT SESSION: Orthosis check and modifications prn; review scar massage; tendon gliding exercises; forearm and hand ROM   Kathrine Cords, MSOT, OTR/L  01/27/2022, 1:24 PM

## 2022-02-03 ENCOUNTER — Encounter: Payer: Self-pay | Admitting: Rehabilitative and Restorative Service Providers"

## 2022-02-03 ENCOUNTER — Ambulatory Visit (INDEPENDENT_AMBULATORY_CARE_PROVIDER_SITE_OTHER): Payer: Self-pay | Admitting: Rehabilitative and Restorative Service Providers"

## 2022-02-03 DIAGNOSIS — M25631 Stiffness of right wrist, not elsewhere classified: Secondary | ICD-10-CM

## 2022-02-03 DIAGNOSIS — M6281 Muscle weakness (generalized): Secondary | ICD-10-CM

## 2022-02-03 DIAGNOSIS — R600 Localized edema: Secondary | ICD-10-CM

## 2022-02-03 DIAGNOSIS — M25531 Pain in right wrist: Secondary | ICD-10-CM

## 2022-02-03 NOTE — Therapy (Signed)
OUTPATIENT OCCUPATIONAL THERAPY TREATMENT NOTE  Patient Name: Richard Conrad MRN: [id number] DOB:Sep 01, 1960, 61 y.o., male Today's Date: 02/03/2022  PCP: Vivi Barrack, MD REFERRING PROVIDER: Sherilyn Cooter, MD   OT End of Session - 02/03/22 1437     Visit Number 2    Number of Visits 13    Date for OT Re-Evaluation 03/26/22    Authorization Type self pay    OT Start Time 8527    OT Stop Time 1552    OT Time Calculation (min) 73 min    Activity Tolerance Patient tolerated treatment well;Patient limited by pain;Patient limited by fatigue    Behavior During Therapy [facility] for tasks assessed/performed              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" (03/23/2016)   History of kidney stones    "passed them"   Hypertension    Hypothyroidism    Iron deficiency anemia    Seasonal allergies    "fall and spring; mild" (03/23/2016)   Past Surgical History:  Procedure Laterality Date   APPENDECTOMY  03/23/2016   COLON RESECTION N/A 03/23/2016   Procedure: LAPAROSCOPIC CECECTOMY;  Surgeon: Coralie Keens, MD;  Location: [place];  Service: General;  Laterality: N/A;   COLON SURGERY  03/23/2016   LAPAROSCOPIC CECECTOMY     COLONOSCOPY     ESOPHAGOGASTRODUODENOSCOPY ENDOSCOPY     HARDWARE REMOVAL Right 01/21/2022   Procedure: HARDWARE REMOVAL WRIST;  Surgeon: Sherilyn Cooter, MD;  Location: [place];  Service: Orthopedics;  Laterality: Right;  61   OPEN REDUCTION INTERNAL FIXATION (ORIF) DISTAL RADIAL FRACTURE Right 10/21/2021   Procedure: RIGHT OPEN REDUCTION INTERNAL FIXATION (ORIF) DISTAL RADIAL FRACTURE;  Surgeon: Sherilyn Cooter, MD;  Location: [place];  Service: Orthopedics;  Laterality: Right;   RADIAL ARTERY ANEURYSM REPAIR  2019   TONSILLECTOMY     VASECTOMY     Patient Active Problem List   Diagnosis Date Noted   Closed fracture of right distal radius  10/13/2021   Dyslipidemia 04/08/2021   Hyperglycemia 04/08/2021   Benign essential HTN 04/11/2020   Hypothyroidism (acquired) 04/11/2020   Gastroesophageal reflux disease without esophagitis 12/06/2017   Seasonal allergic rhinitis 12/06/2017   Colon polyp, hyperplastic 03/23/2016    ONSET DATE: DOI: 10/11/21, first surgery 10/21/21, dorsal spanning bridge plate removal 7/82/42  REFERRING DIAG: S52.571A (ICD-10-CM) - Other closed intra-articular fracture of distal end of right radius, initial encounter  THERAPY DIAG:  Pain in right wrist  Stiffness of right wrist, not elsewhere classified  Localized edema  Muscle weakness (generalized)  Rationale for Evaluation and Treatment Rehabilitation  PERTINENT HISTORY: 2 weeks post DBP removal now. A/A/PROM ok now as tolerated, scar mobs, proprioceptive rehab, and start weaning for ADLs as tolerated, '@4'$  weeks try weaning completely, dynamics as needed, and '@5'$ -6 weeks post-po start hand and wrist strength as tolerated. Caution for "full home use" until ~8 weeks and no heavy/sports recommended until 8-12 weeks.   Comminuted intra-articular R distal radius fracture s/p ORIF 10/21/21 w/ volar distal radius plate and dorsal spanning bridge plate; dorsal spanning plate removal 3/53/61; R ulnar styloid tip fracture   WEIGHT BEARING RESTRICTIONS Caution until 6-8 weeks recommended, no significantly heavy weight (50#+) or sports until 8-12 weeks as tolerated    SUBJECTIVE:   SUBJECTIVE STATEMENT: Pt states very mild pain in past week, mainly  just stiff and limited motion, some numbness and hypersensitivity by 2nd MC dorsally as well. He states he's been trying to do resistive activities for whole arm, and was generally advised against that until motion better or 6 weeks post-op.    PAIN: Are you having pain? Yes: NPRS scale: 1/10 Pain location: dorsal side of MCPs Pain description: stiffness, numbness Aggravating factors: movement Relieving  factors: ice   PATIENT GOALS: Return to PLOF   OBJECTIVE: (All objective assessments below are from initial evaluation on: 01/27/22 unless otherwise specified.)   HAND DOMINANCE: Right  ADLs: Overall ADLs: Able to complete BADLs w/ Mod Ind  FUNCTIONAL OUTCOME MEASURES: Quick Dash: To be assessed  UPPER EXTREMITY ROM:  Active ROM Left 7/21 Right 7/21 Right 9/27 Rt 02/03/22  Wrist flexion 74 '4 17 20  '$ Wrist extension 76 28 24 34  Wrist ulnar deviation 46 unable 9 14  Wrist radial deviation 30 unable 9 14  Wrist pronation  61  79  Wrist supination  21  70  (Blank rows = not tested)  Active ROM Left 9/27 Right 9/27 Right 02/03/22  Thumb MCP (0-60) 60 46 47  Thumb IP (0-80)   70  Thumb Radial abd/add (0-55) 59 44   Thumb Palmar abd/add (0-45) 46 40   Thumb Opposition to Small Finger     Index MCP (0-90) 89 46 65  Index PIP (0-100)   100  Index DIP (0-70)    59  (Blank rows = not tested)  HAND FUNCTION: Unable to assess due to medical restrictions   SENSATION: [facility]; reports some persistent numbness along dorsal aspect of R hand MCPJs  EDEMA: Measured circumferentially 5 cm from distal wrist crease; R elbow: 17.4 cm  L elbow 15.9 cm   TODAY'S TREATMENT: 02/03/22:  He performs AROM to Rt UE for new measures, shows some good improvements since last week, but still very stiff.  OT provides handouts and comprehensive education for recovery including: self-care scar mobilizations, (de)sensitization, joint proprioception (opposite kinematics to wrist and IF MCP J) neuro techniques, AROM, stretches, and using buddy strap to increase IF total active motion.  OT did these each with him (some as guided exercise, some as manual therapy stretches) and had him demo back (as listed below), until he felt comfortable with them and no additional questions.  This is a large plane, but it was explained to spread it out 4-6 x day, and generally mobilize wrist joints and keep moving as much  as tolerated. He states understanding.   Exercises - Standing Elbow Flexion Extension AROM  - 4-6 x daily - 10-15 reps - Turn [employer] Facing Up & Down  - 4-6 x daily - 10-15 reps - Forearm Supination Stretch  - 3-4 x daily - 3-5 reps - 15 sec hold - Forearm Pronation Stretch  - 3-4 x daily - 3-5 reps - 15 sec hold - Bend and Pull Back Wrist SLOWLY  - 4 x daily - 10-15 reps - "Windshield Wipers"   - 4 x daily - 10-15 reps - Wrist AROM Dart Throwers Motion  - 4 x daily - 10-15 reps - Wrist AAROM Flexion and Extension  - 4 x daily - 10-15 reps - Wrist Flexion Stretch  - 4 x daily - 3-5 reps - 15 sec hold - Wrist Prayer Stretch  - 4 x daily - 3-5 reps - 15 sec hold - Seated Wrist Ulnar Deviation Stretch  - 3-5 x daily - 3-5 reps - 15  sec hold - Seated Wrist Radial Deviation Stretch  - 3-5 x daily - 3-5 reps - 15 hold - Tendon Glides  - 4-6 x daily - 3-5 reps - 2-3 seconds hold - Seated Single Finger Extension  - 4-6 x daily - 10-15 reps - BACK KNUCKLE STRETCHES   - 4 x daily - 3-5 reps - 15 sec hold - Seated Finger Composite Flexion Stretch  - 4 x daily - 3-5 reps - 15 hold - PUSH KNUCKLES DOWN  - 4 x daily - 3-5 reps - 15 seconds hold   PATIENT EDUCATION: Ongoing condition-specific education: see tx section above for details.  Person educated: Patient Education method: Customer service manager Education comprehension: verbalized understanding and returned demonstration   HOME EXERCISE PROGRAM: Access Code: [email or web address] URL: [email or web address]   GOALS: Goals reviewed with patient? No  SHORT TERM GOALS: Target date:  03/13/22       Status:  1 Pt will demonstrate independence w/ initial HEP designed for ROM and light strengthening of R wrist and hand  Progressing  2 Pt will improve AROM of R wrist extension to at least 35 degrees, to restore functional motion for tasks like reach and grasp Baseline: Left: 76 deg, Right: 24 deg Progressing    LONG TERM GOALS:  Target date:  03/26/22       Status:  1 Pt to demonstrate independence w/ full HEP designed for increased functional use of R, dominant UE by discharge  Progressing  2 Pt will demonstrate at least 70% of R wrist extension as compared to L wrist to improve efficiency and participation in home maintenance and leisure tasks Baseline: Left: 76 deg, Right: 24 deg Initial  3 Pt will increase AROM of R wrist radial and ulnar deviation to at least 15 degrees for improved mobility of dominant UE during ADL tasks Baseline: R radial dev: 9 deg, R ulnar dev: 9 deg Initial  4 Pt will improve grip strength in R hand to at least 35 lbs for functional use at home and in IADLs Baseline: to be assessed Initial  5 Pt will increase functional use of R, dominant UE during IADLs as evidenced by QuickDASH score 10% or better by discharge Baseline: 34% at initial evaluation 11/20/21 Initial    ASSESSMENT:  CLINICAL IMPRESSION: 02/03/22: Pt with some numbness near dorsal hand scar and occasional pain near base of thumb/scaphoid, otherwise wrist very tight and proximal carpal row not moving/gliding well, so this is being addressed with proprioceptive techniques and joint mobs in upcoming sessions.  He got full, comprehensive plan today and cautioned to not hurt wrist from weight bearing too soon after sx (he states using power tools, etc.).    PLAN: OT FREQUENCY: 1-2x/week (frequency TBD based on financial and commute considerations)  OT DURATION: 10 weeks  PLANNED INTERVENTIONS: self care/ADL training, therapeutic exercise, therapeutic activity, manual therapy, scar mobilization, passive range of motion, splinting, electrical stimulation, ultrasound, iontophoresis, paraffin, fluidotherapy, compression bandaging, moist heat, cryotherapy, contrast bath, and patient/family education  RECOMMENDED OTHER SERVICES: None  CONSULTED AND AGREED WITH PLAN OF CARE: Patient  PLAN FOR NEXT SESSION:  Check motion for  progress. Do Quick DASH. Review key concepts of HEP, do a lot of manual therapy and neuro-proprioceptive work for radiocarpal joint and mid carpal joints, to get them moving. Light joint mobs if tolerated.    Benito Mccreedy, OTR/L, CHT 02/03/2022, 4:29 PM

## 2022-02-03 NOTE — Addendum Note (Signed)
Addended by: Kathrine Cords on: 02/03/2022 12:06 PM   Modules accepted: Orders

## 2022-02-09 ENCOUNTER — Encounter: Payer: Self-pay | Admitting: Rehabilitative and Restorative Service Providers"

## 2022-02-09 ENCOUNTER — Ambulatory Visit (INDEPENDENT_AMBULATORY_CARE_PROVIDER_SITE_OTHER): Payer: Self-pay | Admitting: Rehabilitative and Restorative Service Providers"

## 2022-02-09 DIAGNOSIS — M6281 Muscle weakness (generalized): Secondary | ICD-10-CM

## 2022-02-09 DIAGNOSIS — R600 Localized edema: Secondary | ICD-10-CM

## 2022-02-09 DIAGNOSIS — M25631 Stiffness of right wrist, not elsewhere classified: Secondary | ICD-10-CM

## 2022-02-09 DIAGNOSIS — M25531 Pain in right wrist: Secondary | ICD-10-CM

## 2022-02-09 NOTE — Therapy (Signed)
OUTPATIENT OCCUPATIONAL THERAPY TREATMENT NOTE  Patient Name: Richard Conrad MRN: [id number] DOB:04-Aug-1960, 61 y.o., male Today's Date: 02/09/2022  PCP: Vivi Barrack, MD REFERRING PROVIDER: Sherilyn Cooter, MD   OT End of Session - 02/09/22 1021     Visit Number 3    Number of Visits 13    Date for OT Re-Evaluation 03/26/22    Authorization Type self pay    OT Start Time 1021    OT Stop Time 1113    OT Time Calculation (min) 52 min    Activity Tolerance Patient tolerated treatment well;Patient limited by pain;Patient limited by fatigue    Behavior During Therapy [facility] for tasks assessed/performed              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" (03/23/2016)   History of kidney stones    "passed them"   Hypertension    Hypothyroidism    Iron deficiency anemia    Seasonal allergies    "fall and spring; mild" (03/23/2016)   Past Surgical History:  Procedure Laterality Date   APPENDECTOMY  03/23/2016   COLON RESECTION N/A 03/23/2016   Procedure: LAPAROSCOPIC CECECTOMY;  Surgeon: Coralie Keens, MD;  Location: [place];  Service: General;  Laterality: N/A;   COLON SURGERY  03/23/2016   LAPAROSCOPIC CECECTOMY     COLONOSCOPY     ESOPHAGOGASTRODUODENOSCOPY ENDOSCOPY     HARDWARE REMOVAL Right 01/21/2022   Procedure: HARDWARE REMOVAL WRIST;  Surgeon: Sherilyn Cooter, MD;  Location: [place];  Service: Orthopedics;  Laterality: Right;  61   OPEN REDUCTION INTERNAL FIXATION (ORIF) DISTAL RADIAL FRACTURE Right 10/21/2021   Procedure: RIGHT OPEN REDUCTION INTERNAL FIXATION (ORIF) DISTAL RADIAL FRACTURE;  Surgeon: Sherilyn Cooter, MD;  Location: [place];  Service: Orthopedics;  Laterality: Right;   RADIAL ARTERY ANEURYSM REPAIR  2019   TONSILLECTOMY     VASECTOMY     Patient Active Problem List   Diagnosis Date Noted   Closed fracture of right distal radius  10/13/2021   Dyslipidemia 04/08/2021   Hyperglycemia 04/08/2021   Benign essential HTN 04/11/2020   Hypothyroidism (acquired) 04/11/2020   Gastroesophageal reflux disease without esophagitis 12/06/2017   Seasonal allergic rhinitis 12/06/2017   Colon polyp, hyperplastic 03/23/2016    ONSET DATE: DOI: 10/11/21, first surgery 10/21/21, dorsal spanning bridge plate removal 2/45/80  REFERRING DIAG: S52.571A (ICD-10-CM) - Other closed intra-articular fracture of distal end of right radius, initial encounter  THERAPY DIAG:  Pain in right wrist  Stiffness of right wrist, not elsewhere classified  Localized edema  Muscle weakness (generalized)  Rationale for Evaluation and Treatment Rehabilitation  PERTINENT HISTORY: 3 weeks post DBP removal now. A/A/PROM ok now as tolerated, scar mobs, proprioceptive rehab, and start weaning for ADLs as tolerated, '@4'$  weeks try weaning completely, dynamics as needed, and '@5'$ -6 weeks post-po start hand and wrist strength as tolerated. Caution for "full home use" until ~8 weeks and no heavy/sports recommended until 8-12 weeks.   Comminuted intra-articular R distal radius fracture s/p ORIF 10/21/21 w/ volar distal radius plate and dorsal spanning bridge plate; dorsal spanning plate removal 9/98/33; R ulnar styloid tip fracture   WEIGHT BEARING RESTRICTIONS Caution until 6-8 weeks recommended, no significantly heavy weight (50#+) or sports until 8-12 weeks as tolerated    SUBJECTIVE:   SUBJECTIVE STATEMENT: He states using weed eater,carrying smaller jugs of water,  doing HEP more, feeling just generally tight and still a bit of sensitivity over back of knuckles. He also feels like his wrist is moving easier after ding HEP.    PAIN: Are you having pain? Yes: NPRS scale: 1-2/10 Pain location: dorsal side of MCPs Pain description: stiffness, numbness Aggravating factors: movement Relieving factors: ice   PATIENT GOALS: Return to PLOF   OBJECTIVE: (All  objective assessments below are from initial evaluation on: 01/27/22 unless otherwise specified.)   HAND DOMINANCE: Right  ADLs: Overall ADLs: Able to complete BADLs w/ Mod Ind  FUNCTIONAL OUTCOME MEASURES: 02/09/22: Quick DASH: 13.6 % impairment today   UPPER EXTREMITY ROM:  Active ROM Left 7/21 Right 7/21 Right 9/27 Rt 02/03/22 Rt  02/09/22  Wrist flexion 74 '4 17 20 '$ 12* cold  (19* after joint mobs)   Wrist extension 76 28 24 34 41  Wrist ulnar deviation 46 unable 9 14   Wrist radial deviation 30 unable 9 14   Wrist pronation  61  79   Wrist supination  21  70   (Blank rows = not tested)  Active ROM Left 9/27 Right 9/27 Right 02/03/22 Right 02/09/22  Thumb MCP (0-60) 60 46 47   Thumb IP (0-80)   70   Thumb Radial abd/add (0-55) 59 44    Thumb Palmar abd/add (0-45) 46 40    Thumb Opposition to Small Finger      Index MCP (0-90) 89 46 65 69  Index PIP (0-100)   100 106  Index DIP (0-70)    59 61  (Blank rows = not tested)  HAND FUNCTION: 02/09/22:  Box and Blocks Test 66 blocks today with right UE (71 is AVG)   Pt also shows at least [facility] FMS coordination today as well.   SENSATION: WFL; reports some persistent numbness along dorsal aspect of R hand MCPJs  EDEMA: Measured circumferentially 5 cm from distal wrist crease; R elbow: 17.4 cm  L elbow 15.9 cm   TODAY'S TREATMENT: 02/09/22: He starts with AROM for new measures, which are better in hand/fingers, but still very tight in his wrist.  OT performs manual therapy cupping for scar mobilization, also reviews and performs neuro/ joint mobilization techniques to help with wrist flexion and extension as well as midcarpal joint motions.  OT does manual finger and wrist stretches for him with review as well. He tolerates all this with no added pain, but states feeling pull.  Motion does improve after manual therapy today. He performs coordination box and blocks and also in-hand manipulations- does quickly and well with  no impairments. He also discusses home problems and self-care through Quick DASH today, stating mostly mild issues now and discussing some modifications to activities. He states understanding all instruction and he will keep on HEP consistently.     02/03/22:  He performs AROM to Rt UE for new measures, shows some good improvements since last week, but still very stiff.  OT provides handouts and comprehensive education for recovery including: self-care scar mobilizations, (de)sensitization, joint proprioception (opposite kinematics to wrist and IF MCP J) neuro techniques, AROM, stretches, and using buddy strap to increase IF total active motion.  OT did these each with him (some as guided exercise, some as manual therapy stretches) and had him demo back (as listed below), until he felt comfortable with them and no additional questions.  This is a large plane, but it was explained to spread it out 4-6 x day, and generally mobilize wrist  joints and keep moving as much as tolerated. He states understanding.   Exercises - Standing Elbow Flexion Extension AROM  - 4-6 x daily - 10-15 reps - Turn [employer] Facing Up & Down  - 4-6 x daily - 10-15 reps - Forearm Supination Stretch  - 3-4 x daily - 3-5 reps - 15 sec hold - Forearm Pronation Stretch  - 3-4 x daily - 3-5 reps - 15 sec hold - Bend and Pull Back Wrist SLOWLY  - 4 x daily - 10-15 reps - "Windshield Wipers"   - 4 x daily - 10-15 reps - Wrist AROM Dart Throwers Motion  - 4 x daily - 10-15 reps - Wrist AAROM Flexion and Extension  - 4 x daily - 10-15 reps - Wrist Flexion Stretch  - 4 x daily - 3-5 reps - 15 sec hold - Wrist Prayer Stretch  - 4 x daily - 3-5 reps - 15 sec hold - Seated Wrist Ulnar Deviation Stretch  - 3-5 x daily - 3-5 reps - 15 sec hold - Seated Wrist Radial Deviation Stretch  - 3-5 x daily - 3-5 reps - 15 hold - Tendon Glides  - 4-6 x daily - 3-5 reps - 2-3 seconds hold - Seated Single Finger Extension  - 4-6 x daily - 10-15 reps -  BACK KNUCKLE STRETCHES   - 4 x daily - 3-5 reps - 15 sec hold - Seated Finger Composite Flexion Stretch  - 4 x daily - 3-5 reps - 15 hold - PUSH KNUCKLES DOWN  - 4 x daily - 3-5 reps - 15 seconds hold   PATIENT EDUCATION: Ongoing condition-specific education: see tx section above for details.  Person educated: Patient Education method: Customer service manager Education comprehension: verbalized understanding and returned demonstration   HOME EXERCISE PROGRAM: Access Code: [email or web address] URL: [email or web address]   GOALS: Goals reviewed with patient? No  SHORT TERM GOALS: Target date:  03/13/22       Status:  1 Pt will demonstrate independence w/ initial HEP designed for ROM and light strengthening of R wrist and hand  Progressing  2 Pt will improve AROM of R wrist extension to at least 35 degrees, to restore functional motion for tasks like reach and grasp Baseline: Left: 76 deg, Right: 24 deg Progressing    LONG TERM GOALS: Target date:  03/26/22       Status:  1 Pt to demonstrate independence w/ full HEP designed for increased functional use of R, dominant UE by discharge  Progressing  2 Pt will demonstrate at least 70% of R wrist extension as compared to L wrist to improve efficiency and participation in home maintenance and leisure tasks Baseline: Left: 76 deg, Right: 24 deg Initial  3 Pt will increase AROM of R wrist radial and ulnar deviation to at least 15 degrees for improved mobility of dominant UE during ADL tasks Baseline: R radial dev: 9 deg, R ulnar dev: 9 deg Initial  4 Pt will improve grip strength in R hand to at least 35 lbs for functional use at home and in IADLs Baseline: to be assessed Initial  5 Pt will increase functional use of R, dominant UE during IADLs as evidenced by QuickDASH score 10% or better by discharge Baseline: 34% at initial evaluation 11/20/21 Initial    ASSESSMENT:  CLINICAL IMPRESSION: 02/09/22: His sensations are  improving, his hand is also nearing WNL motion compared to Lt hand. His wrist is staying very stiff (which is somewhat typical of  this procedure).  OT will explore dynamic or static progressive wrist orthotic to help deal with this issue.   02/03/22: Pt with some numbness near dorsal hand scar and occasional pain near base of thumb/scaphoid, otherwise wrist very tight and proximal carpal row not moving/gliding well, so this is being addressed with proprioceptive techniques and joint mobs in upcoming sessions.  He got full, comprehensive plan today and cautioned to not hurt wrist from weight bearing too soon after sx (he states using power tools, etc.).    PLAN: OT FREQUENCY: 1-2x/week (frequency TBD based on financial and commute considerations)  OT DURATION: 10 weeks  PLANNED INTERVENTIONS: self care/ADL training, therapeutic exercise, therapeutic activity, manual therapy, scar mobilization, passive range of motion, splinting, electrical stimulation, ultrasound, iontophoresis, paraffin, fluidotherapy, compression bandaging, moist heat, cryotherapy, contrast bath, and patient/family education  RECOMMENDED OTHER SERVICES: None  CONSULTED AND AGREED WITH PLAN OF CARE: Patient  PLAN FOR NEXT SESSION:  Check motion and any questions on HEP.  Try light hand strength training if tolerated well and consider a mobilizing orthotic for wrist.     Benito Mccreedy, OTR/L, CHT 02/09/2022, 5:15 PM

## 2022-02-16 ENCOUNTER — Ambulatory Visit (INDEPENDENT_AMBULATORY_CARE_PROVIDER_SITE_OTHER): Payer: Self-pay | Admitting: Rehabilitative and Restorative Service Providers"

## 2022-02-16 ENCOUNTER — Encounter: Payer: Self-pay | Admitting: Rehabilitative and Restorative Service Providers"

## 2022-02-16 DIAGNOSIS — M25631 Stiffness of right wrist, not elsewhere classified: Secondary | ICD-10-CM

## 2022-02-16 DIAGNOSIS — M25531 Pain in right wrist: Secondary | ICD-10-CM

## 2022-02-16 DIAGNOSIS — M6281 Muscle weakness (generalized): Secondary | ICD-10-CM

## 2022-02-16 DIAGNOSIS — R600 Localized edema: Secondary | ICD-10-CM

## 2022-02-16 NOTE — Therapy (Signed)
OUTPATIENT OCCUPATIONAL THERAPY TREATMENT NOTE  Patient Name: Richard Conrad MRN: [id number] DOB:06-13-1960, 60 y.o., male Today's Date: 02/16/2022  PCP: Vivi Barrack, MD REFERRING PROVIDER: Sherilyn Cooter, MD   OT End of Session - 02/16/22 [phone]     Visit Number 4    Number of Visits 13    Date for OT Re-Evaluation 03/26/22    Authorization Type self pay    OT Start Time 0938    OT Stop Time 1033    OT Time Calculation (min) 55 min    Activity Tolerance Patient tolerated treatment well;Patient limited by pain;Patient limited by fatigue    Behavior During Therapy [facility] for tasks assessed/performed               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" (03/23/2016)   History of kidney stones    "passed them"   Hypertension    Hypothyroidism    Iron deficiency anemia    Seasonal allergies    "fall and spring; mild" (03/23/2016)   Past Surgical History:  Procedure Laterality Date   APPENDECTOMY  03/23/2016   COLON RESECTION N/A 03/23/2016   Procedure: LAPAROSCOPIC CECECTOMY;  Surgeon: Coralie Keens, MD;  Location: [place];  Service: General;  Laterality: N/A;   COLON SURGERY  03/23/2016   LAPAROSCOPIC CECECTOMY     COLONOSCOPY     ESOPHAGOGASTRODUODENOSCOPY ENDOSCOPY     HARDWARE REMOVAL Right 01/21/2022   Procedure: HARDWARE REMOVAL WRIST;  Surgeon: Sherilyn Cooter, MD;  Location: [place];  Service: Orthopedics;  Laterality: Right;  61   OPEN REDUCTION INTERNAL FIXATION (ORIF) DISTAL RADIAL FRACTURE Right 10/21/2021   Procedure: RIGHT OPEN REDUCTION INTERNAL FIXATION (ORIF) DISTAL RADIAL FRACTURE;  Surgeon: Sherilyn Cooter, MD;  Location: [place];  Service: Orthopedics;  Laterality: Right;   RADIAL ARTERY ANEURYSM REPAIR  2019   TONSILLECTOMY     VASECTOMY     Patient Active Problem List   Diagnosis Date Noted   Closed fracture of right distal  radius 10/13/2021   Dyslipidemia 04/08/2021   Hyperglycemia 04/08/2021   Benign essential HTN 04/11/2020   Hypothyroidism (acquired) 04/11/2020   Gastroesophageal reflux disease without esophagitis 12/06/2017   Seasonal allergic rhinitis 12/06/2017   Colon polyp, hyperplastic 03/23/2016    ONSET DATE: DOI: 10/11/21, first surgery 10/21/21, dorsal spanning bridge plate removal 4/97/02  REFERRING DIAG: S52.571A (ICD-10-CM) - Other closed intra-articular fracture of distal end of right radius, initial encounter  THERAPY DIAG:  Muscle weakness (generalized)  Pain in right wrist  Stiffness of right wrist, not elsewhere classified  Localized edema  Rationale for Evaluation and Treatment Rehabilitation  PERTINENT HISTORY: 3+ weeks post DBP removal now. A/A/PROM ok now as tolerated, scar mobs, proprioceptive rehab, and start weaning for ADLs as tolerated, '@4'$  weeks try weaning completely, dynamics as needed, and '@5'$ -6 weeks post-po start hand and wrist strength as tolerated. Caution for "full home use" until ~8 weeks and no heavy/sports recommended until 8-12 weeks.   Comminuted intra-articular R distal radius fracture s/p ORIF 10/21/21 w/ volar distal radius plate and dorsal spanning bridge plate; dorsal spanning plate removal 6/37/85; R ulnar styloid tip fracture   WEIGHT BEARING RESTRICTIONS Caution until 6-8 weeks recommended, no significantly heavy weight (50#+) or sports until 8-12 weeks as tolerated    SUBJECTIVE:   SUBJECTIVE STATEMENT: He states just being slightly sore and stiff,  but otherwise nothing really new. He's been wearing compresson at night.     PAIN: Are you having pain? Yes: NPRS scale: 1-2/10 Pain location: dorsal side of MCPs Pain description: stiffness, numbness Aggravating factors: movement Relieving factors: ice   PATIENT GOALS: Return to PLOF   OBJECTIVE: (All objective assessments below are from initial evaluation on: 01/27/22 unless otherwise  specified.)   HAND DOMINANCE: Right  ADLs: Overall ADLs: Able to complete BADLs w/ Mod Ind  FUNCTIONAL OUTCOME MEASURES: 02/09/22: Quick DASH: 13.6 % impairment today   UPPER EXTREMITY ROM:  Active ROM Left 7/21 Right 7/21 Right 9/27 Rt 02/03/22 Rt  02/09/22 Rt 02/16/22  Wrist flexion 74 '4 17 20 '$ 12* cold  (19* after joint mobs)  15 "cold" (21 preconditioned)  Wrist extension 76 28 24 34 41 42 "cold" (45 preconditioned)  Wrist ulnar deviation 46 unable 9 14    Wrist radial deviation 30 unable 9 14    Wrist pronation  61  79    Wrist supination  21  70    DTM Ulnar flexion      (-5) "cold" ((-2) preconditioned)  DTM Radial extension      15 "cold" (24 preconditioned)  (Blank rows = not tested)  Active ROM Left 9/27 Right 9/27 Right 02/03/22 Right 02/09/22  Thumb MCP (0-60) 60 46 47   Thumb IP (0-80)   70   Thumb Radial abd/add (0-55) 59 44    Thumb Palmar abd/add (0-45) 46 40    Thumb Opposition to Small Finger      Index MCP (0-90) 89 46 65 69  Index PIP (0-100)   100 106  Index DIP (0-70)    59 61  (Blank rows = not tested)  HAND FUNCTION: 02/16/22: Right hand grip strength: 22# (tender)  (73# Lt hand)    02/09/22:  Box and Blocks Test 66 blocks today with right UE (71 is AVG)   Pt also shows at least [facility] FMS coordination today as well.   SENSATION: WFL; reports some persistent numbness along dorsal aspect of R hand MCPJs  EDEMA: Measured circumferentially 5 cm from distal wrist crease; R elbow: 17.4 cm  L elbow 15.9 cm   TODAY'S TREATMENT: 02/16/22: We start with "cold" measures today, followed by MH for 3 mins, then OT manual joint mobs for radiocarpal joint and midcarpal joint (very tight), also MFR around twisted and ulnarly deviated ulnar wrist, and long stretches in flex and ext. He then he does "pre-conditioned" ROM measures (Modified Week's Test) and has approx 5-7* average gain of motion indicating static progressive orthotic may help. He is  hesitant about this, as he has been "constantly sore" and doesn't want to do anything too stressful for his wrist.  OT explains the benefits and asks him to consider it. OT also applies k-tape to ulnar wrist in attempt to increase proprioception and help correct ulnar sag. OT also provides elastic, circumferential wrist strap to wear with activities to try to prevent soreness that he has been c/o about. OT also encourages him to be doing STM for mid-carpal joint as HEP 4-6 x day as tolerated and also reviews wrist stretches and use of strap for self-proprioceptive activity.     02/09/22: He starts with AROM for new measures, which are better in hand/fingers, but still very tight in his wrist.  OT performs manual therapy cupping for scar mobilization, also reviews and performs neuro/ joint mobilization techniques to help with wrist flexion and extension as  well as midcarpal joint motions.  OT does manual finger and wrist stretches for him with review as well. He tolerates all this with no added pain, but states feeling pull.  Motion does improve after manual therapy today. He performs coordination box and blocks and also in-hand manipulations- does quickly and well with no impairments. He also discusses home problems and self-care through Quick DASH today, stating mostly mild issues now and discussing some modifications to activities. He states understanding all instruction and he will keep on HEP consistently.    PATIENT EDUCATION: Ongoing condition-specific education: see tx section above for details.  Person educated: Patient Education method: Customer service manager Education comprehension: verbalized understanding and returned demonstration   HOME EXERCISE PROGRAM: Access Code: [email or web address] URL: [email or web address]   GOALS: Goals reviewed with patient? No  SHORT TERM GOALS: Target date:  03/13/22       Status:  1 Pt will demonstrate independence w/ initial HEP designed  for ROM and light strengthening of R wrist and hand  Progressing  2 Pt will improve AROM of R wrist extension to at least 35 degrees, to restore functional motion for tasks like reach and grasp Baseline: Left: 76 deg, Right: 24 deg Progressing    LONG TERM GOALS: Target date:  03/26/22       Status:  1 Pt to demonstrate independence w/ full HEP designed for increased functional use of R, dominant UE by discharge  Progressing  2 Pt will demonstrate at least 70% of R wrist extension as compared to L wrist to improve efficiency and participation in home maintenance and leisure tasks Baseline: Left: 76 deg, Right: 24 deg Initial  3 Pt will increase AROM of R wrist radial and ulnar deviation to at least 15 degrees for improved mobility of dominant UE during ADL tasks Baseline: R radial dev: 9 deg, R ulnar dev: 9 deg Initial  4 Pt will improve grip strength in R hand to at least 35 lbs for functional use at home and in IADLs Baseline: to be assessed Initial  5 Pt will increase functional use of R, dominant UE during IADLs as evidenced by QuickDASH score 10% or better by discharge Baseline: 34% at initial evaluation 11/20/21 Initial    ASSESSMENT:  CLINICAL IMPRESSION: 02/16/22: He makes gains after joint mobs and has very tight midcarpal joint (basically no ulnar flexion- movement of trapezium and trapezoid on scaphoid. Ulnar deviated wrist position for immobilization with DSBP is also causing body mechanic issues.   02/09/22: His sensations are improving, his hand is also nearing WNL motion compared to Lt hand. His wrist is staying very stiff (which is somewhat typical of this procedure).  OT will explore dynamic or static progressive wrist orthotic to help deal with this issue.   02/03/22: Pt with some numbness near dorsal hand scar and occasional pain near base of thumb/scaphoid, otherwise wrist very tight and proximal carpal row not moving/gliding well, so this is being addressed with  proprioceptive techniques and joint mobs in upcoming sessions.  He got full, comprehensive plan today and cautioned to not hurt wrist from weight bearing too soon after sx (he states using power tools, etc.).    PLAN: OT FREQUENCY: 1-2x/week (frequency TBD based on financial and commute considerations)  OT DURATION: 10 weeks  PLANNED INTERVENTIONS: self care/ADL training, therapeutic exercise, therapeutic activity, manual therapy, scar mobilization, passive range of motion, splinting, electrical stimulation, ultrasound, iontophoresis, paraffin, fluidotherapy, compression bandaging, moist heat, cryotherapy, contrast bath, and patient/family education  RECOMMENDED  OTHER SERVICES: None  CONSULTED AND AGREED WITH PLAN OF CARE: Patient  PLAN FOR NEXT SESSION:  Keep on trying to mobilize wrist and correct ulnar sag (could be due to neg ulnar variance as well).  Consider static progressive wrist orthotic, if he is willing to try it/use it, etc.  Try light hand strength training if tolerated as well    Benito Mccreedy, OTR/L, CHT 02/16/2022, 10:43 AM

## 2022-02-17 NOTE — Therapy (Signed)
OUTPATIENT OCCUPATIONAL THERAPY TREATMENT NOTE  Patient Name: JERMAN TINNON MRN: [id number] DOB:1960-08-25, 61 y.o., male Today's Date: 02/18/2022  PCP: Vivi Barrack, MD REFERRING PROVIDER: Sherilyn Cooter, MD   OT End of Session - 02/18/22 0848     Visit Number 5    Number of Visits 13    Date for OT Re-Evaluation 03/26/22    Authorization Type self pay    OT Start Time 0848    OT Stop Time 0935    OT Time Calculation (min) 47 min    Equipment Utilized During Treatment orthotic materials    Activity Tolerance Patient tolerated treatment well;Patient limited by fatigue    Behavior During Therapy WFL for tasks assessed/performed                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" (03/23/2016)   History of kidney stones    "passed them"   Hypertension    Hypothyroidism    Iron deficiency anemia    Seasonal allergies    "fall and spring; mild" (03/23/2016)   Past Surgical History:  Procedure Laterality Date   APPENDECTOMY  03/23/2016   COLON RESECTION N/A 03/23/2016   Procedure: LAPAROSCOPIC CECECTOMY;  Surgeon: Coralie Keens, MD;  Location: [place];  Service: General;  Laterality: N/A;   COLON SURGERY  03/23/2016   LAPAROSCOPIC CECECTOMY     COLONOSCOPY     ESOPHAGOGASTRODUODENOSCOPY ENDOSCOPY     HARDWARE REMOVAL Right 01/21/2022   Procedure: HARDWARE REMOVAL WRIST;  Surgeon: Sherilyn Cooter, MD;  Location: [place];  Service: Orthopedics;  Laterality: Right;  61   OPEN REDUCTION INTERNAL FIXATION (ORIF) DISTAL RADIAL FRACTURE Right 10/21/2021   Procedure: RIGHT OPEN REDUCTION INTERNAL FIXATION (ORIF) DISTAL RADIAL FRACTURE;  Surgeon: Sherilyn Cooter, MD;  Location: [place];  Service: Orthopedics;  Laterality: Right;   RADIAL ARTERY ANEURYSM REPAIR  2019   TONSILLECTOMY     VASECTOMY     Patient Active Problem List   Diagnosis Date Noted    Closed fracture of right distal radius 10/13/2021   Dyslipidemia 04/08/2021   Hyperglycemia 04/08/2021   Benign essential HTN 04/11/2020   Hypothyroidism (acquired) 04/11/2020   Gastroesophageal reflux disease without esophagitis 12/06/2017   Seasonal allergic rhinitis 12/06/2017   Colon polyp, hyperplastic 03/23/2016    ONSET DATE: DOI: 10/11/21, first surgery 10/21/21, dorsal spanning bridge plate removal 3/50/09  REFERRING DIAG: S52.571A (ICD-10-CM) - Other closed intra-articular fracture of distal end of right radius, initial encounter  THERAPY DIAG:  Muscle weakness (generalized)  Pain in right wrist  Stiffness of right wrist, not elsewhere classified  Localized edema  Rationale for Evaluation and Treatment Rehabilitation  PERTINENT HISTORY: 4 weeks post DBP removal now. A/A/PROM ok now as tolerated, scar mobs, proprioceptive rehab, and start weaning for ADLs as tolerated, '@4'$  weeks try weaning completely, dynamics as needed, and '@5'$ -6 weeks post-po start hand and wrist strength as tolerated. Caution for "full home use" until ~8 weeks and no heavy/sports recommended until 8-12 weeks.   Comminuted intra-articular R distal radius fracture s/p ORIF 10/21/21 w/ volar distal radius plate and dorsal spanning bridge plate; dorsal spanning plate removal 3/81/82; R ulnar styloid tip fracture   WEIGHT BEARING RESTRICTIONS Caution until 6-8 weeks recommended, no significantly heavy weight (50#+) or sports until 8-12 weeks as tolerated    SUBJECTIVE:   SUBJECTIVE STATEMENT: He  states he wishes to have static progressive orthotic made to help regain motion (and that is suggested/appropriate).    PAIN: Are you having pain? Yes: NPRS scale: 1-2/10 Pain location: dorsal side of MCPs Pain description: stiffness, numbness Aggravating factors: movement Relieving factors: ice   PATIENT GOALS: Return to PLOF   OBJECTIVE: (All objective assessments below are from initial evaluation on:  01/27/22 unless otherwise specified.)   HAND DOMINANCE: Right  ADLs: Overall ADLs: Able to complete BADLs w/ Mod Ind  FUNCTIONAL OUTCOME MEASURES: 02/09/22: Quick DASH: 13.6 % impairment today   UPPER EXTREMITY ROM:  Active ROM Left 7/21 Right 7/21 Right 9/27 Rt 02/03/22 Rt  02/09/22 Rt 02/16/22 Rt  02/18/22  Wrist flexion 74 '4 17 20 '$ 12* cold  (19* after joint mobs)  15 "cold" (21 preconditioned) 18 cold  Wrist extension 76 28 24 34 41 42 "cold" (45 preconditioned) 39 cold  Wrist ulnar deviation 46 unable 9 14     Wrist radial deviation 30 unable 9 14     Wrist pronation  61  79     Wrist supination  21  70     DTM Ulnar flexion      (-5) "cold" ((-2) preconditioned)   DTM Radial extension      15 "cold" (24 preconditioned)   (Blank rows = not tested)  Active ROM Left 9/27 Right 9/27 Right 02/03/22 Right 02/09/22  Thumb MCP (0-60) 60 46 47   Thumb IP (0-80)   70   Thumb Radial abd/add (0-55) 59 44    Thumb Palmar abd/add (0-45) 46 40    Thumb Opposition to Small Finger      Index MCP (0-90) 89 46 65 69  Index PIP (0-100)   100 106  Index DIP (0-70)    59 61  (Blank rows = not tested)  HAND FUNCTION: 02/16/22: Right hand grip strength: 22# (tender)  (73# Lt hand)   02/09/22:  Box and Blocks Test 66 blocks today with right UE (71 is AVG)   Pt also shows at least [facility] FMS coordination today as well.   SENSATION: WFL; reports some persistent numbness along dorsal aspect of R hand MCPJs  EDEMA: Measured circumferentially 5 cm from distal wrist crease; R elbow: 17.4 cm  L elbow 15.9 cm   TODAY'S TREATMENT: 02/18/22: His measures are still very stiff today, and OT begins fabricating new static progressive wrist flexion and extension dynamic orthotic. It's very complex and isn't finished in this session, but OT also leads him through wrist stretches and AROM and small review of HEP.    PATIENT EDUCATION: Ongoing condition-specific education: see tx section above  for details.  Person educated: Patient Education method: Customer service manager Education comprehension: verbalized understanding and returned demonstration   HOME EXERCISE PROGRAM: Access Code: [email or web address] URL: [email or web address]   GOALS: Goals reviewed with patient? No  SHORT TERM GOALS: Target date:  03/13/22       Status:  1 Pt will demonstrate independence w/ initial HEP designed for ROM and light strengthening of R wrist and hand  Progressing  2 Pt will improve AROM of R wrist extension to at least 35 degrees, to restore functional motion for tasks like reach and grasp Baseline: Left: 76 deg, Right: 24 deg Progressing    LONG TERM GOALS: Target date:  03/26/22       Status:  1 Pt to demonstrate independence w/ full HEP designed for increased functional use of R, dominant UE by discharge  Progressing  2 Pt will demonstrate at least 70% of R wrist extension as compared to L wrist to improve efficiency and participation in home maintenance and leisure tasks Baseline: Left: 76 deg, Right: 24 deg Initial  3 Pt will increase AROM of R wrist radial and ulnar deviation to at least 15 degrees for improved mobility of dominant UE during ADL tasks Baseline: R radial dev: 9 deg, R ulnar dev: 9 deg Initial  4 Pt will improve grip strength in R hand to at least 35 lbs for functional use at home and in IADLs Baseline: to be assessed Initial  5 Pt will increase functional use of R, dominant UE during IADLs as evidenced by QuickDASH score 10% or better by discharge Baseline: 34% at initial evaluation 11/20/21 Initial    ASSESSMENT:  CLINICAL IMPRESSION: 02/18/22: Is somewhat stagnant for progress so we are working on dynamic orthotic.   02/16/22: He makes gains after joint mobs and has very tight midcarpal joint (basically no ulnar flexion- movement of trapezium and trapezoid on scaphoid. Ulnar deviated wrist position for immobilization with DSBP is also causing  body mechanic issues.    PLAN: OT FREQUENCY: 1-2x/week (frequency TBD based on financial and commute considerations)  OT DURATION: 10 weeks  PLANNED INTERVENTIONS: self care/ADL training, therapeutic exercise, therapeutic activity, manual therapy, scar mobilization, passive range of motion, splinting, electrical stimulation, ultrasound, iontophoresis, paraffin, fluidotherapy, compression bandaging, moist heat, cryotherapy, contrast bath, and patient/family education  RECOMMENDED OTHER SERVICES: None  CONSULTED AND AGREED WITH PLAN OF CARE: Patient  PLAN FOR NEXT SESSION:  Finish dynamic orthotic and keep working on mobilization, also start grip training again.    Benito Mccreedy, OTR/L, CHT 02/18/2022, 5:44 PM

## 2022-02-18 ENCOUNTER — Ambulatory Visit (INDEPENDENT_AMBULATORY_CARE_PROVIDER_SITE_OTHER): Payer: Self-pay | Admitting: Rehabilitative and Restorative Service Providers"

## 2022-02-18 ENCOUNTER — Encounter: Payer: Self-pay | Admitting: Rehabilitative and Restorative Service Providers"

## 2022-02-18 DIAGNOSIS — M25531 Pain in right wrist: Secondary | ICD-10-CM

## 2022-02-18 DIAGNOSIS — M6281 Muscle weakness (generalized): Secondary | ICD-10-CM

## 2022-02-18 DIAGNOSIS — R600 Localized edema: Secondary | ICD-10-CM

## 2022-02-18 DIAGNOSIS — M25631 Stiffness of right wrist, not elsewhere classified: Secondary | ICD-10-CM

## 2022-02-22 ENCOUNTER — Encounter: Payer: Self-pay | Admitting: Rehabilitative and Restorative Service Providers"

## 2022-02-22 ENCOUNTER — Ambulatory Visit (INDEPENDENT_AMBULATORY_CARE_PROVIDER_SITE_OTHER): Payer: Self-pay | Admitting: Rehabilitative and Restorative Service Providers"

## 2022-02-22 DIAGNOSIS — M25631 Stiffness of right wrist, not elsewhere classified: Secondary | ICD-10-CM

## 2022-02-22 DIAGNOSIS — M6281 Muscle weakness (generalized): Secondary | ICD-10-CM

## 2022-02-22 DIAGNOSIS — M25531 Pain in right wrist: Secondary | ICD-10-CM

## 2022-02-22 DIAGNOSIS — R600 Localized edema: Secondary | ICD-10-CM

## 2022-02-22 NOTE — Therapy (Signed)
OUTPATIENT OCCUPATIONAL THERAPY TREATMENT NOTE  Patient Name: Richard Conrad MRN: [id number] DOB:[birth year], 61 y.o., male Today's Date: 02/22/2022  PCP: Vivi Barrack, MD REFERRING PROVIDER: Sherilyn Cooter, MD   OT End of Session - 02/22/22 0847     Visit Number 6    Number of Visits 13    Date for OT Re-Evaluation 03/26/22    Authorization Type self pay    OT Start Time 0847    OT Stop Time 0928    OT Time Calculation (min) 41 min    Equipment Utilized During Treatment orthotic materials    Activity Tolerance Patient tolerated treatment well;Patient limited by fatigue;Patient limited by pain    Behavior During Therapy WFL for tasks assessed/performed                 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" (03/23/2016)   History of kidney stones    "passed them"   Hypertension    Hypothyroidism    Iron deficiency anemia    Seasonal allergies    "fall and spring; mild" (03/23/2016)   Past Surgical History:  Procedure Laterality Date   APPENDECTOMY  03/23/2016   COLON RESECTION N/A 03/23/2016   Procedure: LAPAROSCOPIC CECECTOMY;  Surgeon: Coralie Keens, MD;  Location: [facility];  Service: General;  Laterality: N/A;   COLON SURGERY  03/23/2016   LAPAROSCOPIC CECECTOMY     COLONOSCOPY     ESOPHAGOGASTRODUODENOSCOPY ENDOSCOPY     HARDWARE REMOVAL Right 01/21/2022   Procedure: HARDWARE REMOVAL WRIST;  Surgeon: Sherilyn Cooter, MD;  Location: [facility];  Service: Orthopedics;  Laterality: Right;  61   OPEN REDUCTION INTERNAL FIXATION (ORIF) DISTAL RADIAL FRACTURE Right 10/21/2021   Procedure: RIGHT OPEN REDUCTION INTERNAL FIXATION (ORIF) DISTAL RADIAL FRACTURE;  Surgeon: Sherilyn Cooter, MD;  Location: [facility];  Service: Orthopedics;  Laterality: Right;   RADIAL ARTERY ANEURYSM REPAIR  2019   TONSILLECTOMY     VASECTOMY     Patient Active Problem List    Diagnosis Date Noted   Closed fracture of right distal radius 10/13/2021   Dyslipidemia 04/08/2021   Hyperglycemia 04/08/2021   Benign essential HTN 04/11/2020   Hypothyroidism (acquired) 04/11/2020   Gastroesophageal reflux disease without esophagitis 12/06/2017   Seasonal allergic rhinitis 12/06/2017   Colon polyp, hyperplastic 03/23/2016    ONSET DATE: DOI: 10/11/21, first surgery 10/21/21, dorsal spanning bridge plate removal 1/44/81  REFERRING DIAG: S52.571A (ICD-10-CM) - Other closed intra-articular fracture of distal end of right radius, initial encounter  THERAPY DIAG:  Muscle weakness (generalized)  Pain in right wrist  Stiffness of right wrist, not elsewhere classified  Localized edema  Rationale for Evaluation and Treatment Rehabilitation  PERTINENT HISTORY: 5 weeks post DBP removal now. _0 -6 weeks post-po start hand and wrist strength as tolerated. Caution for "full home use" until ~8 weeks and no heavy/sports recommended until 8-12 weeks.   Comminuted intra-articular R distal radius fracture s/p ORIF 10/21/21 w/ volar distal radius plate and dorsal spanning bridge plate; dorsal spanning plate removal 8/56/31; R ulnar styloid tip fracture   WEIGHT BEARING RESTRICTIONS Caution until 6-8 weeks recommended, no significantly heavy weight (50#+) or sports until 8-12 weeks as tolerated    SUBJECTIVE:   SUBJECTIVE STATEMENT: He states doing well, just stiff and still sore around scaphoid area.   PAIN: Are you having pain? Yes: NPRS scale:  1-2/10 Pain location: dorsal side of MCPs Pain description: stiffness, numbness Aggravating factors: movement Relieving factors: ice   PATIENT GOALS: Return to PLOF   OBJECTIVE: (All objective assessments below are from initial evaluation on: 01/27/22 unless otherwise specified.)   HAND DOMINANCE: Right  ADLs: Overall ADLs: Able to complete BADLs w/ Mod Ind  FUNCTIONAL OUTCOME MEASURES: 02/09/22: Quick DASH: 13.6 %  impairment today   UPPER EXTREMITY ROM:  Active ROM Left 7/21 Right 7/21 Right 9/27 Rt 02/03/22 Rt  02/09/22 Rt 02/16/22 Rt  02/18/22  Wrist flexion 74 _0 12* cold  (19* after joint mobs)  15 "cold" (21 preconditioned) 18 cold  Wrist extension 76 28 24 34 41 42 "cold" (45 preconditioned) 39 cold  Wrist ulnar deviation 46 unable 9 14     Wrist radial deviation 30 unable 9 14     Wrist pronation  61  79     Wrist supination  21  70     DTM Ulnar flexion      (-5) "cold" ((-2) preconditioned)   DTM Radial extension      15 "cold" (24 preconditioned)   (Blank rows = not tested)  Active ROM Left 9/27 Right 9/27 Right 02/03/22 Right 02/09/22  Thumb MCP (0-60) 60 46 47   Thumb IP (0-80)   70   Thumb Radial abd/add (0-55) 59 44    Thumb Palmar abd/add (0-45) 46 40    Thumb Opposition to Small Finger      Index MCP (0-90) 89 46 65 69  Index PIP (0-100)   100 106  Index DIP (0-70)    59 61  (Blank rows = not tested)  HAND FUNCTION: 02/16/22: Right hand grip strength: 22# (tender)  (73# Lt hand)   02/09/22:  Box and Blocks Test 66 blocks today with right UE (71 is AVG)   Pt also shows at least [facility] FMS coordination today as well.   SENSATION: WFL; reports some persistent numbness along dorsal aspect of R hand MCPJs  EDEMA: Measured circumferentially 5 cm from distal wrist crease; R elbow: 17.4 cm  L elbow 15.9 cm   TODAY'S TREATMENT: 02/22/22: OT finishes fabrication and adjustment of dynamic wrist orthotic and it fits well, gives moderate or more stretch. He dons/doffs, and tolerates wearing for some time in session without any significant problems. He is again edu how to wear/when to take off.  He also does strengthening as below (now 5-6 weeks post op), tolerates well, limited by tightness and weakness tough, especially in wrist extension. He is told to progressively work on this, try not to cause increased pain.   Exercises - Wrist Flexion with Dumbbell  - 4-6 x  daily - 1 sets - 10-15 reps - Wrist Extension with Dumbbell  - 4-6 x daily - 1 sets - 10-15 reps - Hammer Stretch or Strength   - 2-4 x daily - 1-2 sets - 10-15 reps - Wrist AROM Dart Throwers Motion with Resistance  - 2-4 x daily - 1-2 sets - 10-15 reps - Standing Bicep Curls with Resistance  - 2-4 x daily - 1-2 sets - 10-15 reps - Tricep Kick Back with Resistance  - 2-4 x daily - 1-2 sets - 10-15 reps - Standing Shoulder Row with Anchored Resistance  - 2-4 x daily - 1-2 sets - 10-15 reps - Full Fist  - 2-3 x daily - 5 reps    PATIENT EDUCATION: Ongoing condition-specific education: see tx section above for  details.  Person educated: Patient Education method: Customer service manager Education comprehension: verbalized understanding and returned demonstration   HOME EXERCISE PROGRAM: Access Code: [email or web address] URL: [email or web address]   GOALS: Goals reviewed with patient? No  SHORT TERM GOALS: Target date:  03/13/22       Status:  1 Pt will demonstrate independence w/ initial HEP designed for ROM and light strengthening of R wrist and hand  Met  2 Pt will improve AROM of R wrist extension to at least 35 degrees, to restore functional motion for tasks like reach and grasp Baseline: Left: 76 deg, Right: 24 deg Progressing    LONG TERM GOALS: Target date:  03/26/22       Status:  1 Pt to demonstrate independence w/ full HEP designed for increased functional use of R, dominant UE by discharge  Progressing  2 Pt will demonstrate at least 70% of R wrist extension as compared to L wrist to improve efficiency and participation in home maintenance and leisure tasks Baseline: Left: 76 deg, Right: 24 deg Initial  3 Pt will increase AROM of R wrist radial and ulnar deviation to at least 15 degrees for improved mobility of dominant UE during ADL tasks Baseline: R radial dev: 9 deg, R ulnar dev: 9 deg Initial  4 Pt will improve grip strength in R hand to at least 35 lbs  for functional use at home and in IADLs Baseline: to be assessed Initial  5 Pt will increase functional use of R, dominant UE during IADLs as evidenced by QuickDASH score 10% or better by discharge Baseline: 34% at initial evaluation 11/20/21 Initial    ASSESSMENT:  CLINICAL IMPRESSION: 02/22/22: We will see how motion is affected with new orthotic use and strength.   PLAN: OT FREQUENCY: 1-2x/week (frequency TBD based on financial and commute considerations)  OT DURATION: 10 weeks  PLANNED INTERVENTIONS: self care/ADL training, therapeutic exercise, therapeutic activity, manual therapy, scar mobilization, passive range of motion, splinting, electrical stimulation, ultrasound, iontophoresis, paraffin, fluidotherapy, compression bandaging, moist heat, cryotherapy, contrast bath, and patient/family education  RECOMMENDED OTHER SERVICES: None  CONSULTED AND AGREED WITH PLAN OF CARE: Patient  PLAN FOR NEXT SESSION:  Check orthotic as needed, do manual joint mobs to help with motion and use other manual techniques as needed.  Check up on motion. Do some resistive activities if time    Benito Mccreedy, OTR/L, CHT 02/22/2022, 12:37 PM

## 2022-02-24 NOTE — Therapy (Signed)
OUTPATIENT OCCUPATIONAL THERAPY TREATMENT NOTE  Patient Name: Richard Conrad MRN: [id number] DOB:[birth year], 61 y.o., male Today's Date: 02/25/2022  PCP: Ardith Dark, MD REFERRING PROVIDER: Marlyne Beards, MD   OT End of Session - 02/25/22 1350     Visit Number 7    Number of Visits 13    Date for OT Re-Evaluation 03/26/22    Authorization Type self pay    OT Start Time 1350    OT Stop Time 1432    OT Time Calculation (min) 42 min    Equipment Utilized During Treatment --    Activity Tolerance Patient tolerated treatment well;Patient limited by fatigue;Patient limited by pain    Behavior During Therapy [facility] for tasks assessed/performed               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" (03/23/2016)   History of kidney stones    "passed them"   Hypertension    Hypothyroidism    Iron deficiency anemia    Seasonal allergies    "fall and spring; mild" (03/23/2016)   Past Surgical History:  Procedure Laterality Date   APPENDECTOMY  03/23/2016   COLON RESECTION N/A 03/23/2016   Procedure: LAPAROSCOPIC CECECTOMY;  Surgeon: Abigail Miyamoto, MD;  Location: [facility] OR;  Service: General;  Laterality: N/A;   COLON SURGERY  03/23/2016   LAPAROSCOPIC CECECTOMY     COLONOSCOPY     ESOPHAGOGASTRODUODENOSCOPY ENDOSCOPY     HARDWARE REMOVAL Right 01/21/2022   Procedure: HARDWARE REMOVAL WRIST;  Surgeon: Marlyne Beards, MD;  Location: [facility] OR;  Service: Orthopedics;  Laterality: Right;  61   OPEN REDUCTION INTERNAL FIXATION (ORIF) DISTAL RADIAL FRACTURE Right 10/21/2021   Procedure: RIGHT OPEN REDUCTION INTERNAL FIXATION (ORIF) DISTAL RADIAL FRACTURE;  Surgeon: Marlyne Beards, MD;  Location: [facility] OR;  Service: Orthopedics;  Laterality: Right;   RADIAL ARTERY ANEURYSM REPAIR  2019   TONSILLECTOMY     VASECTOMY     Patient Active Problem List   Diagnosis Date  Noted   Closed fracture of right distal radius 10/13/2021   Dyslipidemia 04/08/2021   Hyperglycemia 04/08/2021   Benign essential HTN 04/11/2020   Hypothyroidism (acquired) 04/11/2020   Gastroesophageal reflux disease without esophagitis 12/06/2017   Seasonal allergic rhinitis 12/06/2017   Colon polyp, hyperplastic 03/23/2016    ONSET DATE: DOI: 10/11/21, first surgery 10/21/21, dorsal spanning bridge plate removal 08/10/79  REFERRING DIAG: S52.571A (ICD-10-CM) - Other closed intra-articular fracture of distal end of right radius, initial encounter  THERAPY DIAG:  Muscle weakness (generalized)  Pain in right wrist  Stiffness of right wrist, not elsewhere classified  Localized edema  Rationale for Evaluation and Treatment Rehabilitation  PERTINENT HISTORY: 9 weeks post DBP removal now. @5 -6 weeks post-po start hand and wrist strength as tolerated. Caution for "full home use" until ~8 weeks and no heavy/sports recommended until 8-12 weeks.   Comminuted intra-articular R distal radius fracture s/p ORIF 10/21/21 w/ volar distal radius plate and dorsal spanning bridge plate; dorsal spanning plate removal 1/91/47; R ulnar styloid tip fracture   WEIGHT BEARING RESTRICTIONS Caution with sports and higher level activities until 8-12 weeks as tolerated    SUBJECTIVE:   SUBJECTIVE STATEMENT: He states feeling a muscle strain in bicep and wrist extensors (has small bruise on bicep).  Has worn new orthotic a couple times successfully.   PAIN: Are you  having pain? Yes: NPRS scale: 1-2/10 Pain location: dorsal side of MCPs Pain description: stiffness, numbness Aggravating factors: movement Relieving factors: ice   PATIENT GOALS: Return to PLOF   OBJECTIVE: (All objective assessments below are from initial evaluation on: 01/27/22 unless otherwise specified.)   HAND DOMINANCE: Right  ADLs: Overall ADLs: Able to complete BADLs w/ Mod Ind  FUNCTIONAL OUTCOME MEASURES: 02/09/22:  Quick DASH: 13.6 % impairment today   UPPER EXTREMITY ROM:  Active ROM Left 7/21 Right 7/21 Right 9/27 Rt 02/03/22 Rt  02/09/22 Rt 02/16/22 Rt  02/18/22 Rt 02/25/22  Wrist flexion 74 4 17 20  12* cold  (19* after joint mobs)  15 "cold" (21 preconditioned) 18 cold 24 cold (24 post manual)   Wrist extension 76 28 24 34 41 42 "cold" (45 preconditioned) 39 cold 50 cold   Wrist ulnar deviation 46 unable 9 14      Wrist radial deviation 30 unable 9 14      Wrist pronation  61  79      Wrist supination  21  70      DTM Ulnar flexion      (-5) "cold" ((-2) preconditioned)  6* cold (6* post manual)   DTM Radial extension      15 "cold" (24 preconditioned)  30* cold (35* post manual)  (Blank rows = not tested)  Active ROM Left 9/27 Right 9/27 Right 02/03/22 Right 02/09/22  Thumb MCP (0-60) 60 46 47   Thumb IP (0-80)   70   Thumb Radial abd/add (0-55) 59 44    Thumb Palmar abd/add (0-45) 46 40    Thumb Opposition to Small Finger      Index MCP (0-90) 89 46 65 69  Index PIP (0-100)   100 106  Index DIP (0-70)    59 61  (Blank rows = not tested)  HAND FUNCTION: 02/25/22: Rt grip 36# tender  02/16/22: Right hand grip strength: 22# (tender)  (73# Lt hand)   02/09/22:  Box and Blocks Test 66 blocks today with right UE (71 is AVG)   Pt also shows at least [facility] FMS coordination today as well.   SENSATION: WFL; reports some persistent numbness along dorsal aspect of R hand MCPJs  EDEMA: Measured circumferentially 5 cm from distal wrist crease; R elbow: 17.4 cm  L elbow 15.9 cm   TODAY'S TREATMENT: 02/25/22: OT first checks new orthotic, ensures he can use for long stretches. OT checks motion which shows great improvement.  OT then does manual joint mobs at proximal carpal row as well as in DTM followed by long stretches at wrist to help increase motion. He then does Isometric strength at end ranges (as PNF stretches) and also builds strength with green flexbar in pro, sup, wrist  flex, ext exercises.  Tolerates well with mild discomfort in scaphoid area.    02/22/22: OT finishes fabrication and adjustment of dynamic wrist orthotic and it fits well, gives moderate or more stretch. He dons/doffs, and tolerates wearing for some time in session without any significant problems. He is again edu how to wear/when to take off.  He also does strengthening as below (now 5-6 weeks post op), tolerates well, limited by tightness and weakness tough, especially in wrist extension. He is told to progressively work on this, try not to cause increased pain.   Exercises - Wrist Flexion with Dumbbell  - 4-6 x daily - 1 sets - 10-15 reps - Wrist Extension with Dumbbell  - 4-6  x daily - 1 sets - 10-15 reps - Hammer Stretch or Strength   - 2-4 x daily - 1-2 sets - 10-15 reps - Wrist AROM Dart Throwers Motion with Resistance  - 2-4 x daily - 1-2 sets - 10-15 reps - Standing Bicep Curls with Resistance  - 2-4 x daily - 1-2 sets - 10-15 reps - Tricep Kick Back with Resistance  - 2-4 x daily - 1-2 sets - 10-15 reps - Standing Shoulder Row with Anchored Resistance  - 2-4 x daily - 1-2 sets - 10-15 reps - Full Fist  - 2-3 x daily - 5 reps    PATIENT EDUCATION: Ongoing condition-specific education: see tx section above for details.  Person educated: Patient Education method: Medical illustrator Education comprehension: verbalized understanding and returned demonstration   HOME EXERCISE PROGRAM: Access Code: [email or web address] URL: [email or web address]   GOALS: Goals reviewed with patient? No  SHORT TERM GOALS: Target date:  03/13/22       Status:  1 Pt will demonstrate independence w/ initial HEP designed for ROM and light strengthening of R wrist and hand  Met  2 Pt will improve AROM of R wrist extension to at least 35 degrees, to restore functional motion for tasks like reach and grasp Baseline: Left: 76 deg, Right: 24 deg Progressing    LONG TERM GOALS: Target  date:  03/26/22       Status:  1 Pt to demonstrate independence w/ full HEP designed for increased functional use of R, dominant UE by discharge  Progressing  2 Pt will demonstrate at least 70% of R wrist extension as compared to L wrist to improve efficiency and participation in home maintenance and leisure tasks Baseline: Left: 76 deg, Right: 24 deg Initial  3 Pt will increase AROM of R wrist radial and ulnar deviation to at least 15 degrees for improved mobility of dominant UE during ADL tasks Baseline: R radial dev: 9 deg, R ulnar dev: 9 deg Initial  4 Pt will improve grip strength in R hand to at least 35 lbs for functional use at home and in IADLs Baseline: to be assessed Initial  5 Pt will increase functional use of R, dominant UE during IADLs as evidenced by QuickDASH score 10% or better by discharge Baseline: 34% at initial evaluation 11/20/21 Initial    ASSESSMENT:  CLINICAL IMPRESSION: 02/25/22: He is showing progress now with motion and strength and was even less tender at thumb and wrist.   02/22/22: We will see how motion is affected with new orthotic use and strength.   PLAN: OT FREQUENCY: 1-2x/week (frequency TBD based on financial and commute considerations)  OT DURATION: 10 weeks  PLANNED INTERVENTIONS: self care/ADL training, therapeutic exercise, therapeutic activity, manual therapy, scar mobilization, passive range of motion, splinting, electrical stimulation, ultrasound, iontophoresis, paraffin, fluidotherapy, compression bandaging, moist heat, cryotherapy, contrast bath, and patient/family education  RECOMMENDED OTHER SERVICES: None  CONSULTED AND AGREED WITH PLAN OF CARE: Patient  PLAN FOR NEXT SESSION:   Continue to "push" for more motion and strength and ultimately functional ability, give biceps stretch if needed.     Fannie Knee, OTR/L, CHT 02/25/2022, 5:31 PM

## 2022-02-25 ENCOUNTER — Ambulatory Visit (INDEPENDENT_AMBULATORY_CARE_PROVIDER_SITE_OTHER): Payer: Self-pay | Admitting: Rehabilitative and Restorative Service Providers"

## 2022-02-25 ENCOUNTER — Encounter: Payer: Self-pay | Admitting: Rehabilitative and Restorative Service Providers"

## 2022-02-25 DIAGNOSIS — M25531 Pain in right wrist: Secondary | ICD-10-CM

## 2022-02-25 DIAGNOSIS — R600 Localized edema: Secondary | ICD-10-CM

## 2022-02-25 DIAGNOSIS — M25631 Stiffness of right wrist, not elsewhere classified: Secondary | ICD-10-CM

## 2022-02-25 DIAGNOSIS — M6281 Muscle weakness (generalized): Secondary | ICD-10-CM

## 2022-03-02 ENCOUNTER — Encounter: Payer: Self-pay | Admitting: Rehabilitative and Restorative Service Providers"

## 2022-03-03 NOTE — Therapy (Signed)
OUTPATIENT OCCUPATIONAL THERAPY TREATMENT NOTE  Patient Name: Richard Conrad MRN: [id number] DOB:[birth year], 61 y.o., male Today's Date: 03/04/2022  PCP: Vivi Barrack, MD REFERRING PROVIDER: Sherilyn Cooter, MD   OT End of Session - 03/04/22 1100     Visit Number 8    Number of Visits 13    Date for OT Re-Evaluation 03/26/22    Authorization Type self pay    OT Start Time 1100    OT Stop Time 1148    OT Time Calculation (min) 48 min    Activity Tolerance Patient tolerated treatment well;Patient limited by fatigue;Patient limited by pain    Behavior During Therapy [facility] for tasks assessed/performed               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" (03/23/2016)   History of kidney stones    "passed them"   Hypertension    Hypothyroidism    Iron deficiency anemia    Seasonal allergies    "fall and spring; mild" (03/23/2016)   Past Surgical History:  Procedure Laterality Date   APPENDECTOMY  03/23/2016   COLON RESECTION N/A 03/23/2016   Procedure: LAPAROSCOPIC CECECTOMY;  Surgeon: Coralie Keens, MD;  Location: [place];  Service: General;  Laterality: N/A;   COLON SURGERY  03/23/2016   LAPAROSCOPIC CECECTOMY     COLONOSCOPY     ESOPHAGOGASTRODUODENOSCOPY ENDOSCOPY     HARDWARE REMOVAL Right 01/21/2022   Procedure: HARDWARE REMOVAL WRIST;  Surgeon: Sherilyn Cooter, MD;  Location: [place];  Service: Orthopedics;  Laterality: Right;  61   OPEN REDUCTION INTERNAL FIXATION (ORIF) DISTAL RADIAL FRACTURE Right 10/21/2021   Procedure: RIGHT OPEN REDUCTION INTERNAL FIXATION (ORIF) DISTAL RADIAL FRACTURE;  Surgeon: Sherilyn Cooter, MD;  Location: [place];  Service: Orthopedics;  Laterality: Right;   RADIAL ARTERY ANEURYSM REPAIR  2019   TONSILLECTOMY     VASECTOMY     Patient Active Problem List   Diagnosis Date Noted   Closed fracture of right distal  radius 10/13/2021   Dyslipidemia 04/08/2021   Hyperglycemia 04/08/2021   Benign essential HTN 04/11/2020   Hypothyroidism (acquired) 04/11/2020   Gastroesophageal reflux disease without esophagitis 12/06/2017   Seasonal allergic rhinitis 12/06/2017   Colon polyp, hyperplastic 03/23/2016    ONSET DATE: DOI: 10/11/21, first surgery 10/21/21, dorsal spanning bridge plate removal 3/64/68  REFERRING DIAG: S52.571A (ICD-10-CM) - Other closed intra-articular fracture of distal end of right radius, initial encounter  THERAPY DIAG:  Muscle weakness (generalized)  Pain in right wrist  Stiffness of right wrist, not elsewhere classified  Localized edema  Rationale for Evaluation and Treatment Rehabilitation  PERTINENT HISTORY: 6 weeks post DBP removal now. _0 -6 weeks post-po start hand and wrist strength as tolerated. Caution for "full home use" until ~8 weeks and no heavy/sports recommended until 8-12 weeks.   Comminuted intra-articular R distal radius fracture s/p ORIF 10/21/21 w/ volar distal radius plate and dorsal spanning bridge plate; dorsal spanning plate removal 0/32/12; R ulnar styloid tip fracture   WEIGHT BEARING RESTRICTIONS Caution with sports and higher level activities until 8-12 weeks as tolerated    SUBJECTIVE:   SUBJECTIVE STATEMENT: He states he's been wearing orthotic 1-2 x day, stretching, moving, etc.   PAIN: Are you having pain? No   PATIENT GOALS: Return to PLOF   OBJECTIVE: (All objective assessments below are from initial evaluation  on: 01/27/22 unless otherwise specified.)   HAND DOMINANCE: Right  ADLs: Overall ADLs: Able to complete BADLs w/ Mod Ind  FUNCTIONAL OUTCOME MEASURES: 02/09/22: Quick DASH: 13.6 % impairment today   UPPER EXTREMITY ROM:  Active ROM Left 7/21 Right 7/21 Right 9/27 Rt 02/03/22 Rt  02/09/22 Rt 02/16/22 Rt  02/18/22 Rt 02/25/22 Rt 03/04/22  Wrist flexion 74 _0 12* cold  (19* after joint mobs)  15 "cold" (21  preconditioned) 18 cold 24 cold (24 post manual)  25 post manual  Wrist extension 76 28 24 34 41 42 "cold" (45 preconditioned) 39 cold 50 cold  45 post manual  Wrist ulnar deviation 46 unable 9 14       Wrist radial deviation 30 unable 9 14       Wrist pronation  61  79       Wrist supination  21  70       DTM Ulnar flexion      (-5) "cold" ((-2) preconditioned)  6* cold (6* post manual)    DTM Radial extension      15 "cold" (24 preconditioned)  30* cold (35* post manual)   (Blank rows = not tested)  Active ROM Left 9/27 Right 9/27 Right 02/03/22 Right 02/09/22  Thumb MCP (0-60) 60 46 47   Thumb IP (0-80)   70   Thumb Radial abd/add (0-55) 59 44    Thumb Palmar abd/add (0-45) 46 40    Thumb Opposition to Small Finger      Index MCP (0-90) 89 46 65 69  Index PIP (0-100)   100 106  Index DIP (0-70)    59 61  (Blank rows = not tested)  HAND FUNCTION: 02/25/22: Rt grip 36# tender  02/16/22: Right hand grip strength: 22# (tender)  (73# Lt hand)   02/09/22:  Box and Blocks Test 66 blocks today with right UE (71 is AVG)   Pt also shows at least [facility] FMS coordination today as well.   SENSATION: WFL; reports some persistent numbness along dorsal aspect of R hand MCPJs  EDEMA: Measured circumferentially 5 cm from distal wrist crease; R elbow: 17.4 cm  L elbow 15.9 cm   TODAY'S TREATMENT: 03/04/22: OT continues to "push" for more motion with manual therapies (joint mobs at wrist in DTM and proximal carpal row).  OT also advances his strength and endurance with 20# Farmer's carry (increase grip training and wrist strength) and he also performs golf club swinging activity hitting several foam balls without pain but needing to work on his coordination. OT also edu on biceps stretch to help relieve strained bicep that was also present in last session, but he feels much better in FA and bicep today.     PATIENT EDUCATION: Ongoing condition-specific education: see tx section above for  details.  Person educated: Patient Education method: Customer service manager Education comprehension: verbalized understanding and returned demonstration   HOME EXERCISE PROGRAM: Access Code: [email or web address] URL: [email or web address]   GOALS: Goals reviewed with patient? No  SHORT TERM GOALS: Target date:  03/13/22       Status:  1 Pt will demonstrate independence w/ initial HEP designed for ROM and light strengthening of R wrist and hand  Met  2 Pt will improve AROM of R wrist extension to at least 35 degrees, to restore functional motion for tasks like reach and grasp Baseline: Left: 76 deg, Right: 24 deg Met    LONG TERM GOALS: Target date:  03/26/22  Status:  1 Pt to demonstrate independence w/ full HEP designed for increased functional use of R, dominant UE by discharge  Progressing  2 Pt will demonstrate at least 70% of R wrist extension as compared to L wrist to improve efficiency and participation in home maintenance and leisure tasks Baseline: Left: 76 deg, Right: 24 deg Initial  3 Pt will increase AROM of R wrist radial and ulnar deviation to at least 15 degrees for improved mobility of dominant UE during ADL tasks Baseline: R radial dev: 9 deg, R ulnar dev: 9 deg Initial  4 Pt will improve grip strength in R hand to at least 35 lbs for functional use at home and in IADLs Baseline: to be assessed Initial  5 Pt will increase functional use of R, dominant UE during IADLs as evidenced by QuickDASH score 10% or better by discharge Baseline: 34% at initial evaluation 11/20/21 Initial    ASSESSMENT:  CLINICAL IMPRESSION: 03/04/22: Manual techniques aren't seeming to have a large effect on gaining motion at this point. We will reassess next week for possible d/c if all goals met and he is not making more progress in therapy.    PLAN: OT FREQUENCY: 1-2x/week (frequency TBD based on financial and commute considerations)  OT DURATION: 10  weeks  PLANNED INTERVENTIONS: self care/ADL training, therapeutic exercise, therapeutic activity, manual therapy, scar mobilization, passive range of motion, splinting, electrical stimulation, ultrasound, iontophoresis, paraffin, fluidotherapy, compression bandaging, moist heat, cryotherapy, contrast bath, and patient/family education  RECOMMENDED OTHER SERVICES: None  CONSULTED AND AGREED WITH PLAN OF CARE: Patient  PLAN FOR NEXT SESSION:  Reassess for possible D/C, ensure no fnl issues or complaints as well.   Benito Mccreedy, OTR/L, CHT 03/04/2022, 5:25 PM

## 2022-03-04 ENCOUNTER — Ambulatory Visit (INDEPENDENT_AMBULATORY_CARE_PROVIDER_SITE_OTHER): Payer: Self-pay | Admitting: Rehabilitative and Restorative Service Providers"

## 2022-03-04 ENCOUNTER — Encounter: Payer: Self-pay | Admitting: Rehabilitative and Restorative Service Providers"

## 2022-03-04 DIAGNOSIS — M6281 Muscle weakness (generalized): Secondary | ICD-10-CM

## 2022-03-04 DIAGNOSIS — M25631 Stiffness of right wrist, not elsewhere classified: Secondary | ICD-10-CM

## 2022-03-04 DIAGNOSIS — R600 Localized edema: Secondary | ICD-10-CM

## 2022-03-04 DIAGNOSIS — M25531 Pain in right wrist: Secondary | ICD-10-CM

## 2022-03-09 ENCOUNTER — Encounter: Payer: Self-pay | Admitting: Rehabilitative and Restorative Service Providers"

## 2022-03-10 NOTE — Therapy (Signed)
OUTPATIENT OCCUPATIONAL THERAPY TREATMENT & DISCHARGE NOTE  Patient Name: Richard Conrad MRN: [id number] DOB:January 03, 1961, 61 y.o., male Today's Date: 03/11/2022  PCP: Vivi Barrack, MD REFERRING PROVIDER: Sherilyn Cooter, MD   OT End of Session - 03/11/22 0851     Visit Number 9    Number of Visits 13    Date for OT Re-Evaluation 03/26/22    Authorization Type self pay    OT Start Time 0851    OT Stop Time 0927    OT Time Calculation (min) 36 min    Activity Tolerance Patient tolerated treatment well;Patient limited by fatigue;No increased pain    Behavior During Therapy WFL for tasks assessed/performed                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" (03/23/2016)   History of kidney stones    "passed them"   Hypertension    Hypothyroidism    Iron deficiency anemia    Seasonal allergies    "fall and spring; mild" (03/23/2016)   Past Surgical History:  Procedure Laterality Date   APPENDECTOMY  03/23/2016   COLON RESECTION N/A 03/23/2016   Procedure: LAPAROSCOPIC CECECTOMY;  Surgeon: Coralie Keens, MD;  Location: [place];  Service: General;  Laterality: N/A;   COLON SURGERY  03/23/2016   LAPAROSCOPIC CECECTOMY     COLONOSCOPY     ESOPHAGOGASTRODUODENOSCOPY ENDOSCOPY     HARDWARE REMOVAL Right 01/21/2022   Procedure: HARDWARE REMOVAL WRIST;  Surgeon: Sherilyn Cooter, MD;  Location: [place];  Service: Orthopedics;  Laterality: Right;  61   OPEN REDUCTION INTERNAL FIXATION (ORIF) DISTAL RADIAL FRACTURE Right 10/21/2021   Procedure: RIGHT OPEN REDUCTION INTERNAL FIXATION (ORIF) DISTAL RADIAL FRACTURE;  Surgeon: Sherilyn Cooter, MD;  Location: [place];  Service: Orthopedics;  Laterality: Right;   RADIAL ARTERY ANEURYSM REPAIR  2019   TONSILLECTOMY     VASECTOMY     Patient Active Problem List   Diagnosis Date Noted   Closed fracture of right  distal radius 10/13/2021   Dyslipidemia 04/08/2021   Hyperglycemia 04/08/2021   Benign essential HTN 04/11/2020   Hypothyroidism (acquired) 04/11/2020   Gastroesophageal reflux disease without esophagitis 12/06/2017   Seasonal allergic rhinitis 12/06/2017   Colon polyp, hyperplastic 03/23/2016    ONSET DATE: DOI: 10/11/21, first surgery 10/21/21, dorsal spanning bridge plate removal 08/01/00  REFERRING DIAG: S52.571A (ICD-10-CM) - Other closed intra-articular fracture of distal end of right radius, initial encounter  THERAPY DIAG:  Muscle weakness (generalized)  Pain in right wrist  Stiffness of right wrist, not elsewhere classified  Localized edema  Rationale for Evaluation and Treatment Rehabilitation  PERTINENT HISTORY: 7 weeks post DBP removal now. _0 -6 weeks post-po start hand and wrist strength as tolerated. Caution for "full home use" until ~8 weeks and no heavy/sports recommended until 8-12 weeks.   Comminuted intra-articular R distal radius fracture s/p ORIF 10/21/21 w/ volar distal radius plate and dorsal spanning bridge plate; dorsal spanning plate removal 11/25/34; R ulnar styloid tip fracture   WEIGHT BEARING RESTRICTIONS Caution with sports and higher level activities until 8-12 weeks as tolerated, otherwise WBAT   SUBJECTIVE:   SUBJECTIVE STATEMENT: He states doing well, less aching recently. He is comfortable with HEP and will continue on. His MD also "cleared" him as well.     PAIN: Are you having pain? No  PATIENT GOALS: Return to PLOF   OBJECTIVE: (All objective assessments below are from initial evaluation on: 01/27/22 unless otherwise specified.)    FUNCTIONAL OUTCOME MEASURES: 03/11/22: Quick DASH 2% today   02/09/22: Quick DASH: 13.6 % impairment today   UPPER EXTREMITY ROM:  Active ROM Left 7/21 Right 7/21 Right 9/27 Rt  02/09/22 Rt 03/04/22 Rt 03/11/22  Wrist flexion 74 4 17 12* cold  (19* after joint mobs)  25 post manual 33*  Wrist  extension 76 28 24 41 45 post manual 46*   Wrist ulnar deviation 46 unable 9   31*  Wrist radial deviation 30 unable 9   16*  Wrist pronation  61    71*  Wrist supination  21    83*  DTM Ulnar flexion      10*  DTM Radial extension      35*  (Blank rows = not tested)  Active ROM Left 9/27 Right 9/27 Right 02/03/22 Right 02/09/22 Right 03/11/22  Thumb MCP (0-60) 60 46 47  47 (compared to 62* opposite)   Thumb IP (0-80)   70  82  Thumb Radial abd/add (0-55) 59 44     Thumb Palmar abd/add (0-45) 46 40     Thumb Opposition to Small Finger       Index MCP (0-90) 89 46 65 69 75  Index PIP (0-100)   100 106 105  Index DIP (0-70)    59 61 61  (Blank rows = not tested)   HAND FUNCTION: 03/11/22: Rt grip 48# today (99# Lt)   02/25/22: Rt grip 36# tender  02/16/22: Right hand grip strength: 22# (tender)  (73# Lt hand)   02/09/22:  Box and Blocks Test 66 blocks today with right UE (71 is AVG)   Pt also shows at least [facility] FMS coordination today as well.    SENSATION: WFL; reports some persistent numbness along dorsal aspect of R hand MCPJs   EDEMA: Measured circumferentially 5 cm from distal wrist crease; R elbow: 17.4 cm  L elbow 15.9 cm   TODAY'S TREATMENT: 03/11/22: Pt performs AROM, gripping, and strength with Rt hand against resistance for exercise/activities as well as new measures today. Using that data, OT also reviews home exercises and provides final recommendations. OT also discusses home and functional tasks with the pt and reviews goals. Pt states understanding everything and is pleased with outcomes.     PATIENT EDUCATION: Ongoing condition-specific education: see tx section above for details.  Person educated: Patient Education method: Customer service manager Education comprehension: verbalized understanding and returned demonstration   HOME EXERCISE PROGRAM: Access Code: [email or web address] URL: [email or web address]   GOALS: Goals reviewed  with patient? No  SHORT TERM GOALS: Target date:  03/13/22       Status:  1 Pt will demonstrate independence w/ initial HEP designed for ROM and light strengthening of R wrist and hand  Met  2 Pt will improve AROM of R wrist extension to at least 35 degrees, to restore functional motion for tasks like reach and grasp Baseline: Left: 76 deg, Right: 24 deg Met    LONG TERM GOALS: Target date:  03/26/22       Status:  1 Pt to demonstrate independence w/ full HEP designed for increased functional use of R, dominant UE by discharge  MET  2 Pt will demonstrate at least 70% of R wrist extension as compared to L wrist to improve efficiency and participation in home maintenance and leisure tasks Baseline:  Left: 76 deg, Right: 24 deg Not met 70% but much improved now  3 Pt will increase AROM of R wrist radial and ulnar deviation to at least 15 degrees for improved mobility of dominant UE during ADL tasks Baseline: R radial dev: 9 deg, R ulnar dev: 9 deg MET - 31* and 16*   4 Pt will improve grip strength in R hand to at least 35 lbs for functional use at home and in IADLs Baseline: to be assessed MET  5 Pt will increase functional use of R, dominant UE during IADLs as evidenced by QuickDASH score 10% or better by discharge Baseline: 34% at initial evaluation 11/20/21 MET 2% now     ASSESSMENT:  CLINICAL IMPRESSION: 03/11/22: He meets al goals to satisfaction/maximized. He will d/c to self-management now. He was cautioned for no very heavy/difficult tasks for another month as tolerated.     PLAN: OT FREQUENCY D/c   OT DURATION:D/C  PLANNED INTERVENTIONS: self care/ADL training, therapeutic exercise, therapeutic activity, manual therapy, scar mobilization, passive range of motion, splinting, electrical stimulation, ultrasound, iontophoresis, paraffin, fluidotherapy, compression bandaging, moist heat, cryotherapy, contrast bath, and patient/family education  RECOMMENDED OTHER SERVICES:  None  CONSULTED AND AGREED WITH PLAN OF CARE: Patient  PLAN FOR NEXT SESSION:  D/C now as planned    Benito Mccreedy, OTR/L, CHT 03/11/2022, 10:22 AM  OCCUPATIONAL THERAPY DISCHARGE SUMMARY  Visits from Start of Care: 9  Current functional level related to goals / functional outcomes: Pt has met all goals to satisfactory levels and is pleased with outcomes.   Remaining deficits: Pt has no more significant functional deficits or pain.   Education / Equipment: Pt has all needed materials and education. Pt understands how to continue on with self-management. See tx notes for more details.   Patient agrees to discharge due to max benefits received from outpatient occupational therapy / hand therapy at this time.   Benito Mccreedy, OTR/L, CHT 03/11/22

## 2022-03-11 ENCOUNTER — Ambulatory Visit (INDEPENDENT_AMBULATORY_CARE_PROVIDER_SITE_OTHER): Payer: Self-pay | Admitting: Rehabilitative and Restorative Service Providers"

## 2022-03-11 ENCOUNTER — Encounter: Payer: Self-pay | Admitting: Rehabilitative and Restorative Service Providers"

## 2022-03-11 DIAGNOSIS — R600 Localized edema: Secondary | ICD-10-CM

## 2022-03-11 DIAGNOSIS — M25531 Pain in right wrist: Secondary | ICD-10-CM

## 2022-03-11 DIAGNOSIS — M6281 Muscle weakness (generalized): Secondary | ICD-10-CM

## 2022-03-11 DIAGNOSIS — M25631 Stiffness of right wrist, not elsewhere classified: Secondary | ICD-10-CM

## 2022-04-09 ENCOUNTER — Encounter: Payer: Self-pay | Admitting: Family Medicine

## 2022-04-15 ENCOUNTER — Encounter: Payer: Self-pay | Admitting: *Deleted

## 2022-12-03 ENCOUNTER — Emergency Department (HOSPITAL_COMMUNITY): Payer: Self-pay

## 2022-12-03 ENCOUNTER — Inpatient Hospital Stay (HOSPITAL_COMMUNITY): Payer: Self-pay

## 2022-12-03 ENCOUNTER — Inpatient Hospital Stay (HOSPITAL_COMMUNITY)
Admission: EM | Admit: 2022-12-03 | Discharge: 2022-12-06 | DRG: 064 | Disposition: A | Discharge status: Home / Self Care | Payer: Self-pay | Attending: Neurology | Admitting: Neurology

## 2022-12-03 DIAGNOSIS — R1310 Dysphagia, unspecified: Secondary | ICD-10-CM | POA: Diagnosis present

## 2022-12-03 DIAGNOSIS — G935 Compression of brain: Secondary | ICD-10-CM | POA: Diagnosis present

## 2022-12-03 DIAGNOSIS — K219 Gastro-esophageal reflux disease without esophagitis: Secondary | ICD-10-CM | POA: Diagnosis present

## 2022-12-03 DIAGNOSIS — Z881 Allergy status to other antibiotic agents status: Secondary | ICD-10-CM

## 2022-12-03 DIAGNOSIS — I161 Hypertensive emergency: Secondary | ICD-10-CM | POA: Diagnosis present

## 2022-12-03 DIAGNOSIS — G936 Cerebral edema: Secondary | ICD-10-CM | POA: Diagnosis present

## 2022-12-03 DIAGNOSIS — Z79899 Other long term (current) drug therapy: Secondary | ICD-10-CM

## 2022-12-03 DIAGNOSIS — I610 Nontraumatic intracerebral hemorrhage in hemisphere, subcortical: Principal | ICD-10-CM | POA: Diagnosis present

## 2022-12-03 DIAGNOSIS — E039 Hypothyroidism, unspecified: Secondary | ICD-10-CM | POA: Diagnosis present

## 2022-12-03 DIAGNOSIS — S06360A Traumatic hemorrhage of cerebrum, unspecified, without loss of consciousness, initial encounter: Principal | ICD-10-CM

## 2022-12-03 DIAGNOSIS — Z79890 Hormone replacement therapy (postmenopausal): Secondary | ICD-10-CM

## 2022-12-03 DIAGNOSIS — R29810 Facial weakness: Secondary | ICD-10-CM | POA: Diagnosis present

## 2022-12-03 DIAGNOSIS — J302 Other seasonal allergic rhinitis: Secondary | ICD-10-CM | POA: Diagnosis present

## 2022-12-03 DIAGNOSIS — E785 Hyperlipidemia, unspecified: Secondary | ICD-10-CM | POA: Diagnosis present

## 2022-12-03 DIAGNOSIS — I619 Nontraumatic intracerebral hemorrhage, unspecified: Secondary | ICD-10-CM | POA: Diagnosis present

## 2022-12-03 DIAGNOSIS — G8194 Hemiplegia, unspecified affecting left nondominant side: Secondary | ICD-10-CM | POA: Diagnosis present

## 2022-12-03 DIAGNOSIS — R471 Dysarthria and anarthria: Secondary | ICD-10-CM | POA: Diagnosis present

## 2022-12-03 DIAGNOSIS — R29708 NIHSS score 8: Secondary | ICD-10-CM | POA: Diagnosis present

## 2022-12-03 LAB — SODIUM
Sodium: 133 mmol/L — ABNORMAL LOW (ref 135–145)
Sodium: 139 mmol/L (ref 135–145)

## 2022-12-03 LAB — CBG MONITORING, ED: Glucose-Capillary: 148 mg/dL — ABNORMAL HIGH (ref 70–99)

## 2022-12-03 LAB — I-STAT CHEM 8, ED
BUN: 23 mg/dL (ref 8–23)
Calcium, Ion: 1.06 mmol/L — ABNORMAL LOW (ref 1.15–1.40)
Chloride: 101 mmol/L (ref 98–111)
Creatinine, Ser: 0.9 mg/dL (ref 0.61–1.24)
Glucose, Bld: 142 mg/dL — ABNORMAL HIGH (ref 70–99)
HCT: 37 % — ABNORMAL LOW (ref 39.0–52.0)
Hemoglobin: 12.6 g/dL — ABNORMAL LOW (ref 13.0–17.0)
Potassium: 4.3 mmol/L (ref 3.5–5.1)
Sodium: 134 mmol/L — ABNORMAL LOW (ref 135–145)
TCO2: 25 mmol/L (ref 22–32)

## 2022-12-03 LAB — COMPREHENSIVE METABOLIC PANEL
ALT: 21 U/L (ref 0–44)
AST: 26 U/L (ref 15–41)
Albumin: 4 g/dL (ref 3.5–5.0)
Alkaline Phosphatase: 50 U/L (ref 38–126)
Anion gap: 10 (ref 5–15)
BUN: 16 mg/dL (ref 8–23)
CO2: 22 mmol/L (ref 22–32)
Calcium: 8.7 mg/dL — ABNORMAL LOW (ref 8.9–10.3)
Chloride: 99 mmol/L (ref 98–111)
Creatinine, Ser: 0.95 mg/dL (ref 0.61–1.24)
GFR, Estimated: >60 mL/min (ref >60)
Glucose, Bld: 138 mg/dL — ABNORMAL HIGH (ref 70–99)
Potassium: 4.4 mmol/L (ref 3.5–5.1)
Sodium: 131 mmol/L — ABNORMAL LOW (ref 135–145)
Total Bilirubin: 1.2 mg/dL (ref 0.3–1.2)
Total Protein: 7 g/dL (ref 6.5–8.1)

## 2022-12-03 LAB — ETHANOL: Alcohol, Ethyl (B): <10 mg/dL (ref <10)

## 2022-12-03 LAB — RAPID URINE DRUG SCREEN, HOSP PERFORMED
Amphetamines: NOT DETECTED
Barbiturates: NOT DETECTED
Benzodiazepines: NOT DETECTED
Cocaine: NOT DETECTED
Opiates: NOT DETECTED
Tetrahydrocannabinol: NOT DETECTED

## 2022-12-03 LAB — URINALYSIS, ROUTINE W REFLEX MICROSCOPIC
Bilirubin Urine: NEGATIVE
Glucose, UA: NEGATIVE mg/dL
Hgb urine dipstick: NEGATIVE
Ketones, ur: 5 mg/dL — AB
Leukocytes,Ua: NEGATIVE
Nitrite: NEGATIVE
Protein, ur: NEGATIVE mg/dL
Specific Gravity, Urine: 1.026 (ref 1.005–1.030)
pH: 7 (ref 5.0–8.0)

## 2022-12-03 LAB — DIFFERENTIAL
Abs Immature Granulocytes: 0.06 10*3/uL (ref 0.00–0.07)
Basophils Absolute: 0 10*3/uL (ref 0.0–0.1)
Basophils Relative: 0 %
Eosinophils Absolute: 0 10*3/uL (ref 0.0–0.5)
Eosinophils Relative: 0 %
Immature Granulocytes: 0 %
Lymphocytes Relative: 10 %
Lymphs Abs: 1.3 10*3/uL (ref 0.7–4.0)
Monocytes Absolute: 0.6 10*3/uL (ref 0.1–1.0)
Monocytes Relative: 5 %
Neutro Abs: 11.7 10*3/uL — ABNORMAL HIGH (ref 1.7–7.7)
Neutrophils Relative %: 85 %

## 2022-12-03 LAB — CBC
HCT: 36.8 % — ABNORMAL LOW (ref 39.0–52.0)
Hemoglobin: 12.1 g/dL — ABNORMAL LOW (ref 13.0–17.0)
MCH: 31.1 pg (ref 26.0–34.0)
MCHC: 32.9 g/dL (ref 30.0–36.0)
MCV: 94.6 fL (ref 80.0–100.0)
Platelets: 315 10*3/uL (ref 150–400)
RBC: 3.89 MIL/uL — ABNORMAL LOW (ref 4.22–5.81)
RDW: 13.3 % (ref 11.5–15.5)
WBC: 13.8 10*3/uL — ABNORMAL HIGH (ref 4.0–10.5)
nRBC: 0 % (ref 0.0–0.2)

## 2022-12-03 LAB — PROTIME-INR
INR: 1 (ref 0.8–1.2)
Prothrombin Time: 13.1 seconds (ref 11.4–15.2)

## 2022-12-03 LAB — APTT: aPTT: 29 seconds (ref 24–36)

## 2022-12-03 LAB — MRSA NEXT GEN BY PCR, NASAL: MRSA by PCR Next Gen: NOT DETECTED

## 2022-12-03 MED ORDER — ORAL CARE MOUTH RINSE: 15.0000 mL | OROMUCOSAL | Status: DC | PRN

## 2022-12-03 MED ORDER — SODIUM CHLORIDE 3% (HYPERTONIC SALINE) BOLUS VIA INFUSION
250.0000 mL | Freq: Once | INTRAVENOUS | Status: AC
  Administered 2022-12-03: 250 mL via INTRAVENOUS
  Filled 2022-12-03: qty 250

## 2022-12-03 MED ORDER — PANTOPRAZOLE SODIUM 40 MG IV SOLR
40.0000 mg | Freq: Every day | INTRAVENOUS | Status: DC
  Administered 2022-12-03: 40 mg via INTRAVENOUS
  Filled 2022-12-03: qty 10

## 2022-12-03 MED ORDER — STROKE: EARLY STAGES OF RECOVERY BOOK
Freq: Once | Status: AC
  Filled 2022-12-03: qty 1

## 2022-12-03 MED ORDER — CHLORHEXIDINE GLUCONATE CLOTH 2 % EX PADS
6.0000 | MEDICATED_PAD | Freq: Every day | CUTANEOUS | Status: DC
  Administered 2022-12-03: 6 via TOPICAL

## 2022-12-03 MED ORDER — IOHEXOL 350 MG/ML SOLN
75.0000 mL | Freq: Once | INTRAVENOUS | Status: AC | PRN
  Administered 2022-12-03: 75 mL via INTRAVENOUS

## 2022-12-03 MED ORDER — ACETAMINOPHEN 650 MG RE SUPP: 650.0000 mg | RECTAL | Status: DC | PRN

## 2022-12-03 MED ORDER — CLEVIDIPINE BUTYRATE 0.5 MG/ML IV EMUL
0.0000 mg/h | INTRAVENOUS | Status: DC
  Administered 2022-12-03: 2 mg/h via INTRAVENOUS
  Filled 2022-12-03: qty 100

## 2022-12-03 MED ORDER — ACETAMINOPHEN 160 MG/5ML PO SOLN: 650.0000 mg | ORAL | Status: DC | PRN

## 2022-12-03 MED ORDER — ACETAMINOPHEN 325 MG PO TABS
650.0000 mg | ORAL_TABLET | ORAL | Status: DC | PRN
  Administered 2022-12-03 – 2022-12-06 (×8): 650 mg via ORAL
  Filled 2022-12-03 (×8): qty 2

## 2022-12-03 MED ORDER — SENNOSIDES-DOCUSATE SODIUM 8.6-50 MG PO TABS
1.0000 | ORAL_TABLET | Freq: Two times a day (BID) | ORAL | Status: DC
  Filled 2022-12-03 (×2): qty 1

## 2022-12-03 MED ORDER — SODIUM CHLORIDE 3 % IV SOLN
INTRAVENOUS | Status: DC
  Filled 2022-12-03 (×5): qty 500

## 2022-12-03 NOTE — H&P (Addendum)
Neurology H&P Ref MD : Dr Rosalia Hammers CC: Headache, left-sided weakness, left facial droop  History is obtained from: Patient, EMS and chart  HPI: Richard Conrad is a 62 y.o. male with history of hypothyroidism and hypertension who presents with sudden onset slurred speech, confusion, left-sided facial droop and left-sided weakness.  Patient states that he had gone to the gym earlier today to lift weights and had had a headache throughout the morning.  At about 10:00, he was going to meet a friend for lunch when he had sudden onset confusion and left-sided weakness.  His friend called EMS and he was brought to the hospital.  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  Due to early signs of midline shift being present, hypertonic saline was started.  Patient states that at home, he is blood pressure is normally under control.  On Saturday, he had an episode of vertigo and nausea, but this resolved spontaneously.  He denies other symptoms.  He states that he would like to be a full code at this time but does not want to be kept alive through artificial means for a prolonged period of time. CT head showed 2.2 x 3.7 x 2.6 cm 11 cubic cc right basal ganglia hematoma 20 mg, but no intraventricular creatinine.  There is mild cytotoxic edema and right to left midline shift.  No hydrocephalus. CT angiogram of the head shows no large vessel stenosis or occlusion or aneurysm. ICH score 0  LKW: 1000 tpa given?: No, ICH IR Thrombectomy? No, ICH Modified Rankin Scale: 0-Completely asymptomatic and back to baseline post- stroke   NIHSS:  1a Level of Conscious.: 1 1b LOC Questions: 0 1c LOC Commands: 0 2 Best Gaze: 0 3 Visual: 0 4 Facial Palsy: 2 5a Motor Arm - left: 3 5b Motor Arm - Right: 0 6a Motor Leg - Left: 2 6b Motor Leg - Right: 0 7 Limb Ataxia: 0 8 Sensory: 0 9 Best Language: 0 10 Dysarthria: 0 11 Extinct and Inattention.: 1 TOTAL: 8      ROS: A complete ROS was performed and is  negative except as noted in the HPI.   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" (03/23/2016)   History of kidney stones    "passed them"   Hypertension    Hypothyroidism    Iron deficiency anemia    Seasonal allergies    "fall and spring; mild" (03/23/2016)     Family History  Problem Relation Age of Onset   Cancer Mother    Hyperlipidemia Mother    Cancer Father    Hyperlipidemia Father      Social History:  reports that he has never smoked. He has never used smokeless tobacco. He reports that he does not drink alcohol and does not use drugs.   Prior to Admission medications   Medication Sig Start Date End Date Taking? Authorizing Provider  amLODipine (NORVASC) 5 MG tablet Take 1 tablet (5 mg total) by mouth daily. Patient taking differently: Take 5 mg by mouth at bedtime. 04/07/21   Ardith Dark, MD  Ascorbic Acid (VITAMIN C PO) Take 1 tablet by mouth daily as needed (immune support).    [provider]  Carboxymethylcellul-Glycerin (LUBRICATING EYE DROPS OP) Place 1 drop into both eyes daily as needed (dry eyes).    [provider]  cetirizine (ZYRTEC) 10 MG tablet Take 10 mg by mouth daily as needed for allergies.    [provider]  Cholecalciferol (DIALYVITE VITAMIN D 5000) 125 MCG (5000 UT) capsule Take 5,000 Units by mouth daily.    [provider]  levothyroxine (SYNTHROID) 75 MCG tablet TAKE 1 TABLET ON MONDAY, TUESDAY, THURSDAY , FRIDAY AND SATURDAY. AND TAKE 1 AND 1/2 [facility] AND SUNDAY Patient taking differently: Take 75-112.5 mcg by mouth See admin instructions. TAKE 75 MCG ON MONDAY, TUESDAY, THURSDAY , FRIDAY AND SATURDAY. AND TAKE 112.5 MCG ON [facility] AND SUNDAY 04/07/21   Ardith Dark, MD  magnesium oxide (MAG-OX) 400 (240 Mg) MG tablet Take 400 mg by mouth daily as needed (Immune Support).    [provider]  Multiple Vitamins-Minerals (MULTIVITAMIN MEN PO) Take 1 tablet by mouth daily.    [provider]     Exam: Current vital signs: BP 134/80   Pulse 86   Resp 13   Wt 80 kg   SpO2 100%   BMI 26.05 kg/m    Physical Exam  Constitutional: Appears well-developed and well-nourished middle-aged Caucasian male.  Psych: Affect appropriate to situation Eyes: No scleral injection HENT: No OP obstrucion Head: Normocephalic.  Cardiovascular: Normal rate and regular rhythm.  Respiratory: Effort normal and breath sounds normal to anterior ascultation GI: Soft.  No distension. There is no tenderness.  Skin: WDI  Neuro: Mental Status: Patient was initially drowsy but awoke to stimulation, alert, oriented to person, place, month, year, and situation. Patient is able to give a clear and coherent history. No signs of aphasia  Cranial Nerves: II: Visual Fields are full. Pupils are equal, round, and reactive to light.   III,IV, VI: EOMI without ptosis or diplopia.  V: Facial sensation is symmetric to light touch VII: Left-sided facial droop VIII: Hearing is intact to voice X: phonation intact XII: tongue is midline without atrophy or fasciculations.  Motor: Tone is normal. Bulk is normal. 5/5 strength was present in right upper and lower extremities, 2/5 strength in left upper extremity, 3/5 strength and left lower extremity Sensation is symmetric to light touch and temperature in the arms and legs.  Left-sided extinction to DSS present Reflexes are symmetric. Gait not tested  I have reviewed labs in epic and the pertinent results are: CBC    Component Value Date/Time   WBC 13.8 (H) 12/03/2022 1404   RBC 3.89 (L) 12/03/2022 1404   HGB 12.6 (L) 12/03/2022 1409   HCT 37.0 (L) 12/03/2022 1409   PLT 315 12/03/2022 1404   MCV 94.6 12/03/2022 1404   MCH 31.1 12/03/2022 1404   MCHC 32.9 12/03/2022 1404   RDW 13.3 12/03/2022 1404   LYMPHSABS 1.3 12/03/2022 1404    MONOABS 0.6 12/03/2022 1404   EOSABS 0.0 12/03/2022 1404   BASOSABS 0.0 12/03/2022 1404       Latest Ref Rng & Units 12/03/2022    2:09 PM 12/03/2022    2:04 PM 01/21/2022    8:16 AM  BMP  Glucose 70 - 99 mg/dL 161  096  045   BUN 8 - 23 mg/dL 23  16  14    Creatinine 0.61 - 1.24 mg/dL 4.09  8.11  9.14   Sodium 135 - 145 mmol/L 134  131  136   Potassium 3.5 - 5.1 mmol/L 4.3  4.4  4.3   Chloride 98 - 111 mmol/L 101  99  102   CO2 22 - 32 mmol/L  22  26   Calcium 8.9 - 10.3 mg/dL  8.7  9.3      I have reviewed the images obtained:  CT head: Acute 11 cc IPH in right basal ganglia with no IVH and minimal leftward midline shift  CTA head and neck: No LVO, no aneurysm or AVM  Impression: Acute ICH involving right basal ganglia with cytotoxic edema and mild right to left midline shift., likely hypertensive in origin  Recommendations: -Admit to ICU for close monitoring, hypertonic saline administration and blood pressure control -Goal systolic blood pressure 130-150 for first 24 hours, less than 160 following 9, use Cleviprex as needed -3% hypertonic saline begin with 250 cc bolus then continue at 75 cc/h -Monitor sodium every 6 hours -Head CT at 8 PM tonight for stability scan -MRI brain with and without contrast -No anticoagulants or antiplatelets -Vital signs and NIH stroke scale every hour  Pt seen by NP/Neuro and later by MD. Note/plan to be edited by MD as needed.  Cortney E Ernestina Columbia , MSN, AGACNP-BC [facility] See Amion for schedule and pager information 12/03/2022 2:50 PM   STROKE MD NOTE :  I have personally obtained history,examined this patient, reviewed notes, independently viewed imaging studies, participated in medical decision making and plan of care.ROS completed by me personally and pertinent positives fully documented  I have made any additions or clarifications directly to the above note. Agree with note above.  Patient presented with sudden onset of a  headache followed by left facial droop and left hemiparesis with CT scan showing a moderate-sized left right basal ganglia hemorrhage with mild cytotoxic edema and midline shift.  Etiology likely hypertensive.  Neurological exam remains.  Left hemiparesis facial droop and dysarthria. . patient remains at risk for neurological worsening and hematoma expansion and hydrocephalus.  Recommend close neurological observation and admission to the neurological ICU.  Strict control of hypertension with blood pressure goal systolic between 409-811 for the first 24 hours and then below 160.  Repeat CT scan 8 hours for stability.  Start hypertonic saline drip with serum sodium goal 150-155.  DVT and GI prophylaxis.  Long discussion with patient and his wife at the bedside and answered questions about his presentation, prognosis and plan of care and answered questions. This patient is critically ill and at significant risk of neurological worsening, death and care requires constant monitoring of vital signs, hemodynamics,respiratory and cardiac monitoring, extensive review of multiple databases, frequent neurological assessment, discussion with family, other specialists and medical decision making of high complexity.I have made any additions or clarifications directly to the above note.This critical care time does not reflect procedure time, or teaching time or supervisory time of PA/NP/Med Resident etc but could involve care discussion time.  I spent 50 minutes of neurocritical care time  in the care of  this patient.     Delia Heady, MD Medical Director [facility] Pager: [phone] 12/03/2022 3:13 PM

## 2022-12-03 NOTE — ED Provider Notes (Signed)
[place] EMERGENCY DEPARTMENT AT [facility] Provider Note   CSN: [id number] Arrival date & time: 12/03/22  1404  An emergency department physician performed an initial assessment on this suspected stroke patient at 1359.  History  Chief Complaint  Patient presents with   Code Stroke    OLIVE MOTYKA is a 62 y.o. male.  HPI 62 year old male with last known normal 10 AM presents today with left-sided weakness.  Wife states that he left around 10 this morning.  She received a call from his friend that he was feeding for lunch that he was not his normal self.  EMS was called and was noted to have left-sided facial droop left arm and left leg weakness.  He was initiated as code stroke and presented to the ED.  I am seeing him after he has returned from CT scanner.     Home Medications Prior to Admission medications   Medication Sig Start Date End Date Taking? Authorizing Provider  amLODipine (NORVASC) 5 MG tablet Take 1 tablet (5 mg total) by mouth daily. Patient taking differently: Take 5 mg by mouth at bedtime. 04/07/21   Ardith Dark, MD  Ascorbic Acid (VITAMIN C PO) Take 1 tablet by mouth daily as needed (immune support).    [provider]  Carboxymethylcellul-Glycerin (LUBRICATING EYE DROPS OP) Place 1 drop into both eyes daily as needed (dry eyes).    [provider]  cetirizine (ZYRTEC) 10 MG tablet Take 10 mg by mouth daily as needed for allergies.    [provider]  Cholecalciferol (DIALYVITE VITAMIN D 5000) 125 MCG (5000 UT) capsule Take 5,000 Units by mouth daily.    [provider]  levothyroxine (SYNTHROID) 75 MCG tablet TAKE 1 TABLET ON MONDAY, TUESDAY, THURSDAY , FRIDAY AND SATURDAY. AND TAKE 1 AND 1/2 [facility] AND SUNDAY Patient taking differently: Take 75-112.5 mcg by mouth See admin instructions. TAKE 75 MCG ON MONDAY, TUESDAY, THURSDAY , FRIDAY AND SATURDAY. AND TAKE 112.5 MCG ON [facility] AND SUNDAY 04/07/21   Ardith Dark, MD  magnesium oxide (MAG-OX) 400 (240 Mg) MG tablet Take 400 mg by mouth daily as needed (Immune Support).    [provider]  Multiple Vitamins-Minerals (MULTIVITAMIN MEN PO) Take 1 tablet by mouth daily.    [provider]      Allergies    Ciprofloxacin and Doxycycline    Review of Systems   Review of Systems  Physical Exam Updated Vital Signs BP (!) 143/89   Pulse 96   Temp 98.2 F (36.8 C)   Resp 18   Wt 80 kg   SpO2 100%   BMI 26.05 kg/m  Physical Exam Vitals reviewed.  HENT:     Head: Normocephalic and atraumatic.     Right Ear: External ear normal.     Left Ear: External ear normal.     Nose: Nose normal.     Mouth/Throat:     Pharynx: Oropharynx is clear.  Eyes:     Extraocular Movements: Extraocular movements intact.     Pupils: Pupils are equal, round, and reactive to light.  Cardiovascular:     Rate and Rhythm: Normal rate and regular rhythm.     Pulses: Normal pulses.  Pulmonary:     Effort: Pulmonary effort is normal.  Abdominal:     Palpations: Abdomen is soft.     Tenderness: There is no rebound.  Musculoskeletal:        General: Normal range of  motion.     Cervical back: Normal range of motion.  Skin:    General: Skin is warm and dry.     Capillary Refill: Capillary refill takes less than 2 seconds.  Neurological:     Mental Status: He is alert.     Cranial Nerves: No cranial nerve deficit.     Motor: No weakness.     Comments: Left-sided facial droop with left arm and left leg drift  Psychiatric:        Mood and Affect: Mood normal.     ED Results / Procedures / Treatments   Labs (all labs ordered are listed, but only abnormal results are displayed) Labs Reviewed  CBC - Abnormal; Notable for the following components:      Result Value   WBC 13.8 (*)    RBC 3.89 (*)    Hemoglobin 12.1 (*)    HCT 36.8 (*)    All other components within normal limits  DIFFERENTIAL - Abnormal; Notable for the following  components:   Neutro Abs 11.7 (*)    All other components within normal limits  COMPREHENSIVE METABOLIC PANEL - Abnormal; Notable for the following components:   Sodium 131 (*)    Glucose, Bld 138 (*)    Calcium 8.7 (*)    All other components within normal limits  CBG MONITORING, ED - Abnormal; Notable for the following components:   Glucose-Capillary 148 (*)    All other components within normal limits  I-STAT CHEM 8, ED - Abnormal; Notable for the following components:   Sodium 134 (*)    Glucose, Bld 142 (*)    Calcium, Ion 1.06 (*)    Hemoglobin 12.6 (*)    HCT 37.0 (*)    All other components within normal limits  MRSA NEXT GEN BY PCR, NASAL  ETHANOL  PROTIME-INR  APTT  RAPID URINE DRUG SCREEN, HOSP PERFORMED  URINALYSIS, ROUTINE W REFLEX MICROSCOPIC  SODIUM  SODIUM  SODIUM    EKG None  Radiology CT ANGIO HEAD NECK W WO CM (CODE STROKE)  Result Date: 12/03/2022 CLINICAL DATA:  Neuro deficit, acute, stroke suspected EXAM: CT ANGIOGRAPHY HEAD AND NECK WITH AND WITHOUT CONTRAST TECHNIQUE: Multidetector CT imaging of the head and neck was performed using the standard protocol during bolus administration of intravenous contrast. Multiplanar CT image reconstructions and MIPs were obtained to evaluate the vascular anatomy. Carotid stenosis measurements (when applicable) are obtained utilizing NASCET criteria, using the distal internal carotid diameter as the denominator. RADIATION DOSE REDUCTION: This exam was performed according to the departmental dose-optimization program which includes automated exposure control, adjustment of the mA and/or kV according to patient size and/or use of iterative reconstruction technique. CONTRAST:  75mL OMNIPAQUE IOHEXOL 350 MG/ML SOLN COMPARISON:  Same day CT head. FINDINGS: CTA NECK FINDINGS Aortic arch: Great vessel origins are patent. Right carotid system: No evidence of dissection, stenosis (50% or greater), or occlusion. Left carotid system:  No evidence of dissection, stenosis (50% or greater), or occlusion. Vertebral arteries: Right dominant. No evidence of dissection, stenosis (50% or greater), or occlusion. Skeleton: Negative. Other neck: Negative. Upper chest: Visualized lung apices are clear. Review of the MIP images confirms the above findings CTA HEAD FINDINGS Anterior circulation: Bilateral intracranial ICAs, MCAs and ACAs are patent without proximal hemodynamically significant stenosis. No aneurysm or arteriovenous malformation identified in the region of acute hemorrhage; however, acute blood products does limit assessment. Posterior circulation: Bilateral intradural vertebral arteries, basilar artery and both posterior cerebral arteries  are patent without proximal hemodynamically significant stenosis. Left fetal type PCA, anatomic variant. Venous sinuses: As permitted by contrast timing, patent. Review of the MIP images confirms the above findings IMPRESSION: 1. No aneurysm or arteriovenous malformation identified in the region of acute hemorrhage; however, acute blood products does limit assessment. 2. No emergent large vessel occlusion or proximal hemodynamically significant stenosis. Electronically Signed   By: Feliberto Harts M.D.   On: 12/03/2022 14:27   CT HEAD CODE STROKE WO CONTRAST  Result Date: 12/03/2022 CLINICAL DATA:  Code stroke. EXAM: CT HEAD WITHOUT CONTRAST TECHNIQUE: Contiguous axial images were obtained from the base of the skull through the vertex without intravenous contrast. RADIATION DOSE REDUCTION: This exam was performed according to the departmental dose-optimization program which includes automated exposure control, adjustment of the mA and/or kV according to patient size and/or use of iterative reconstruction technique. COMPARISON:  None Available. FINDINGS: Brain: There is a 2.2 x 3.7 x 2.6 cm (~ 11 cc) parenchymal hematoma centered around the right basal ganglia and insula. There is no evidence of  intraventricular extension. Minimal leftward midline shift measuring up to 2 mm. No hydrocephalus. No CT evidence of an acute cortical infarct. Vascular: No hyperdense vessel or unexpected calcification. Skull: Normal. Negative for fracture or focal lesion. Sinuses/Orbits: No acute finding. Other: None. ASPECTS [facility] Stroke Program Early CT Score) IMPRESSION: Acute parenchymal hematoma centered around the right basal ganglia and insula measuring up to 2.2 x 3.7 x 2.6 cm. No evidence of intraventricular extension. Minimal leftward midline shift measuring up to 2 mm. Findings were paged to Dr. Pearlean Brownie on 12/03/22 at 2:17 PM via [facility] paging system. Electronically Signed   By: Lorenza Cambridge M.D.   On: 12/03/2022 14:18    Procedures .Critical Care  Performed by: Margarita Grizzle, MD Authorized by: Margarita Grizzle, MD   Critical care provider statement:    Critical care time (minutes):  30   Critical care was necessary to treat or prevent imminent or life-threatening deterioration of the following conditions:  CNS failure or compromise   Critical care was time spent personally by me on the following activities:  Development of treatment plan with patient or surrogate, discussions with consultants, evaluation of patient's response to treatment, examination of patient, ordering and review of laboratory studies, ordering and review of radiographic studies, ordering and performing treatments and interventions, pulse oximetry, re-evaluation of patient's condition and review of old charts     Medications Ordered in ED Medications  clevidipine (CLEVIPREX) infusion 0.5 mg/mL (2 mg/hr Intravenous New Bag/Given 12/03/22 1426)  sodium chloride (hypertonic) 3 % solution ( Intravenous New Bag/Given 12/03/22 1428)   stroke: early stages of recovery book (has no administration in time range)  acetaminophen (TYLENOL) tablet 650 mg (has no administration in time range)    Or  acetaminophen (TYLENOL) 160 MG/5ML solution 650 mg  (has no administration in time range)    Or  acetaminophen (TYLENOL) suppository 650 mg (has no administration in time range)  senna-docusate (Senokot-S) tablet 1 tablet (has no administration in time range)  pantoprazole (PROTONIX) injection 40 mg (has no administration in time range)  Chlorhexidine Gluconate Cloth 2 % PADS 6 each (has no administration in time range)  Oral care mouth rinse (has no administration in time range)  iohexol (OMNIPAQUE) 350 MG/ML injection 75 mL (75 mLs Intravenous Contrast Given 12/03/22 1417)  sodium chloride 3% bolus via infusion 250 mL (250 mLs Intravenous Bolus from Bag 12/03/22 1428)    ED Course/ Medical  Decision Making/ A&P                                 Medical Decision Making Amount and/or Complexity of Data Reviewed Labs: ordered. Radiology: ordered.  Risk Decision regarding hospitalization.   62 year old male presents today with acute onset of left-sided weakness.  Airway appears intact Patient with basal ganglier hemorrhage Blood pressure here is 143/89 does not require acute intervention at this time Patient is not on any blood thinners Neurology is at bedside and has assumed care for admission to ICU       Final Clinical Impression(s) / ED Diagnoses Final diagnoses:  Intracerebral hematoma [facility])    Rx / DC Orders ED Discharge Orders     None         Margarita Grizzle, MD 12/03/22 1514

## 2022-12-03 NOTE — Code Documentation (Signed)
Richard Conrad is a 62 yr old male with a history of hypertension presenting to [facility] on 12/03/2022. He is from home where he was last known well at 1000. Several hours later he was found to be weak on the left side. He did have a H/A this morning, now, just pressure. He is on no thinners. Stroke alert activated by EMS.    Pt met by stroke team on arrival. Labs and CBG obtained. Airway cleared by EDP. Pt was slightly drowsy, weak on left arm (antigravity) and leg (can't resist gravity) , and had left facial droop. Intermittently extinguishes the left to double simultaneous stimulation. NIHSS 9. (Please see flowsheet for NIHSS details and timeline).    Pt to CT with the team. CT performed. Per Dr. Pearlean Brownie, CT showing Rt Basal ganglia hemorrhage. BP 146/74. CTA then obtained. Per Dr. Pearlean Brownie, CTA neg for macrovascular cause. Pt back to ED room 33. BP elevated at that time, so cleviprex gtt initiated and titrated per protocol. Hypertonic saline gtt started as well with a 250 mL bolus to be followed by a 75 cc/hr infusion. Neuro ICU alerted of need for bed. Bedside handoff with [employer] RN. Pt not eligible for thrombectomy or thrombolysis due to intracerebral hemorrhage. Pt will need BP between 130-150. HOB>30 degrees. Stroke swallow screen before any po intake. SCDs for DVT prophylaxis. Stability scan in 6 hours.

## 2022-12-04 ENCOUNTER — Inpatient Hospital Stay (HOSPITAL_COMMUNITY): Payer: Self-pay

## 2022-12-04 DIAGNOSIS — I610 Nontraumatic intracerebral hemorrhage in hemisphere, subcortical: Secondary | ICD-10-CM

## 2022-12-04 DIAGNOSIS — I10 Essential (primary) hypertension: Secondary | ICD-10-CM

## 2022-12-04 MED ORDER — GADOBUTROL 1 MMOL/ML IV SOLN
7.0000 mL | Freq: Once | INTRAVENOUS | Status: AC | PRN
  Administered 2022-12-04: 7 mL via INTRAVENOUS

## 2022-12-04 MED ORDER — PANTOPRAZOLE SODIUM 40 MG PO TBEC
40.0000 mg | DELAYED_RELEASE_TABLET | Freq: Every day | ORAL | Status: DC
  Administered 2022-12-04 – 2022-12-06 (×3): 40 mg via ORAL
  Filled 2022-12-04 (×3): qty 1

## 2022-12-04 MED ORDER — HEPARIN SODIUM (PORCINE) 5000 UNIT/ML IJ SOLN
5000.0000 [IU] | Freq: Three times a day (TID) | INTRAMUSCULAR | Status: DC
  Administered 2022-12-04 – 2022-12-06 (×6): 5000 [IU] via SUBCUTANEOUS
  Filled 2022-12-04 (×6): qty 1

## 2022-12-04 MED ORDER — MULTIVITAMIN MEN PO TABS: ORAL_TABLET | Freq: Every day | ORAL | Status: DC

## 2022-12-04 MED ORDER — AMLODIPINE BESYLATE 5 MG PO TABS
5.0000 mg | ORAL_TABLET | Freq: Every day | ORAL | Status: DC
  Administered 2022-12-04 – 2022-12-06 (×3): 5 mg via ORAL
  Filled 2022-12-04 (×3): qty 1

## 2022-12-04 MED ORDER — ADULT MULTIVITAMIN W/MINERALS CH
1.0000 | ORAL_TABLET | Freq: Every day | ORAL | Status: DC
  Administered 2022-12-04 – 2022-12-05 (×2): 1 via ORAL
  Filled 2022-12-04 (×2): qty 1

## 2022-12-04 MED ORDER — LEVOTHYROXINE SODIUM 75 MCG PO TABS
112.5000 ug | ORAL_TABLET | ORAL | Status: DC
  Administered 2022-12-05: 112.5 ug via ORAL
  Filled 2022-12-04: qty 2

## 2022-12-04 MED ORDER — LEVOTHYROXINE SODIUM 75 MCG PO TABS: 75.0000 ug | ORAL_TABLET | ORAL | Status: DC

## 2022-12-04 MED ORDER — LEVOTHYROXINE SODIUM 75 MCG PO TABS
75.0000 ug | ORAL_TABLET | ORAL | Status: DC
  Administered 2022-12-06: 75 ug via ORAL
  Filled 2022-12-04: qty 1

## 2022-12-04 NOTE — Evaluation (Signed)
Occupational Therapy Evaluation Patient Details Name: Richard Conrad MRN: [id number] DOB: 02-16-1961 Today's Date: 12/04/2022   History of Present Illness The pt is a 62 yo male presenting 8/2 with acute onset slurred speech, confusion, and L-sided weakness. 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 PMH includes: HTN and hypothyroidism. (Simultaneous filing. User may not have seen previous data.)   Clinical Impression   PT admitted with R basal ganglia CVA. Pt currently with functional limitiations due to the deficits listed below (see OT problem list). Pt at baseline is retired goes to [employer] every morning and drives. Pt at this time demonstrate L side weakness with decrease coordination and attention.  Pt given additional education on deficits to attempt to bring further awareness to deficits in the setting of a R CVA. Pt will benefit from skilled OT to increase their independence and safety with adls and balance to allow discharge outpatient.       Recommendations for follow up therapy are one component of a multi-disciplinary discharge planning process, led by the attending physician.  Recommendations may be updated based on patient status, additional functional criteria and insurance authorization.   Assistance Recommended at Discharge Set up Supervision/Assistance  Patient can return home with the following A little help with walking and/or transfers;A little help with bathing/dressing/bathroom    Functional Status Assessment  Patient has had a recent decline in their functional status and demonstrates the ability to make significant improvements in function in a reasonable and predictable amount of time.  Equipment Recommendations  None recommended by OT    Recommendations for Other Services       Precautions / Restrictions Precautions Precautions: Fall (Simultaneous filing. User may not have seen previous data.) Precaution Comments: inattention to L Restrictions Weight  Bearing Restrictions: No (Simultaneous filing. User may not have seen previous data.)      Mobility Bed Mobility Overal bed mobility: Modified Independent             General bed mobility comments: exiting on the L side    Transfers Overall transfer level: Needs assistance   Transfers: Sit to/from Stand Sit to Stand: Min guard           General transfer comment: pt with posterior bias initially and lifting toes off the ground      Balance Overall balance assessment: Needs assistance Sitting-balance support: No upper extremity supported, Feet supported Sitting balance-Leahy Scale: Fair     Standing balance support: No upper extremity supported, During functional activity Standing balance-Leahy Scale: Fair               High level balance activites: Direction changes, Turns, Sudden stops, Head turns High Level Balance Comments: pt noted to have LOB on the left side with attempt to reach for L rail attempting steps. pt brushing therapist walkign on the L side           ADL either performed or assessed with clinical judgement   ADL Overall ADL's : Needs assistance/impaired Eating/Feeding: Minimal assistance;Sitting   Grooming: Wash/dry hands;Min guard;Standing           Upper Body Dressing : Minimal assistance;Sitting   Lower Body Dressing: Min guard;Bed level Lower Body Dressing Details (indicate cue type and reason): donning socks Toilet Transfer: Min guard;Ambulation;Regular Toilet           Functional mobility during ADLs: Minimal assistance       Vision Baseline Vision/History: 1 Wears glasses Ability to See in Adequate Light:  1 Impaired Patient Visual Report: Blurring of vision Vision Assessment?: Yes Ocular Range of Motion: Within Functional Limits Alignment/Gaze Preference: Within Defined Limits Additional Comments: pt reports that the R eye is blurry compared to the L eye     Perception Perception Perception Tested?:  Yes Perception Deficits: Inattention/neglect Inattention/Neglect: Does not attend to left side of body   Praxis      Pertinent Vitals/Pain       Hand Dominance Right   Extremity/Trunk Assessment Upper Extremity Assessment Upper Extremity Assessment: LUE deficits/detail LUE Deficits / Details: decrease coordintation. pt with wfl grasp, pt denies any numbness or changes in sensation LUE Coordination: decreased gross motor   Lower Extremity Assessment Lower Extremity Assessment: Defer to PT evaluation   Cervical / Trunk Assessment Cervical / Trunk Assessment: Normal   Communication Communication Communication: No difficulties (Simultaneous filing. User may not have seen previous data.)   Cognition Arousal/Alertness: Awake/alert Behavior During Therapy: WFL for tasks assessed/performed Overall Cognitive Status: Impaired/Different from baseline Area of Impairment: Safety/judgement, Awareness                         Safety/Judgement: Decreased awareness of safety, Decreased awareness of deficits Awareness: Anticipatory   General Comments: pt with L side weakness and needs visual attention to sustain attention to holding a cup. pt pouring drink in lap and shocked when the cold liquid hits his skin.     General Comments  RA VSS    Exercises     Shoulder Instructions      Home Living Family/patient expects to be discharged to:: Private residence Living Arrangements: Spouse/significant other Available Help at Discharge: Available 24 hours/day;Family Type of Home: House Home Access: Stairs to enter [employer] of Steps: 3 Entrance Stairs-Rails: Left Home Layout: One level     Bathroom Shower/Tub: Producer, television/film/video: Standard Bathroom Accessibility: Yes How Accessible: Accessible via walker Home Equipment: Cane - single point;Hand held shower head   Additional Comments: paula is wife, he has 14 grandchildren, 6 children (  4biological, 1 adopted and 1 step) normally works out every morning      Prior Functioning/Environment Prior Level of Function : Independent/Modified Independent;Driving             Mobility Comments: working out, independent ADLs Comments: independent, retired        Pharmacist, community Problem List: Decreased activity tolerance;Impaired balance (sitting and/or standing);Impaired vision/perception;Decreased cognition;Decreased safety awareness      OT Treatment/Interventions: Self-care/ADL training;DME and/or AE instruction;Manual therapy;Therapeutic activities;Cognitive remediation/compensation;Patient/family education;Balance training    OT Goals(Current goals can be found in the care plan section) Acute Rehab OT Goals Patient Stated Goal: to return home OT Goal Formulation: With patient Time For Goal Achievement: 12/18/22 Potential to Achieve Goals: Good  OT Frequency: Min 1X/week    Co-evaluation              AM-PAC OT "6 Clicks" Daily Activity     Outcome Measure Help from another person eating meals?: None Help from another person taking care of personal grooming?: None Help from another person toileting, which includes using toliet, bedpan, or urinal?: A Little Help from another person bathing (including washing, rinsing, drying)?: A Little Help from another person to put on and taking off regular upper body clothing?: None Help from another person to put on and taking off regular lower body clothing?: A Little 6 Click Score: 21   End of Session Equipment Utilized During Treatment: Gait  belt Nurse Communication: Mobility status;Precautions  Activity Tolerance: Patient tolerated treatment well Patient left: in chair;with call bell/phone within reach;with chair alarm set;with family/visitor present  OT Visit Diagnosis: Unsteadiness on feet (R26.81);Muscle weakness (generalized) (M62.81)                Time: 7846-9629 OT Time Calculation (min): 41 min Charges:  OT General  Charges $OT Visit: 1 Visit OT Evaluation $OT Eval Moderate Complexity: 1 Mod   Brynn, OTR/L  Acute Rehabilitation Services Office: [phone] .   Mateo Flow 12/04/2022, 12:47 PM

## 2022-12-04 NOTE — Progress Notes (Signed)
   12/04/22 1430  Spiritual Encounters  Type of Visit Initial  Care provided to: Pt and family  Referral source Patient request  Reason for visit Advance directives  OnCall Visit Yes   CH and [place] visited pt. per [facility] consult for assistance w/AD.  Pt. sitting up in bed with visitor at bedside.  He shared that he previously completed an advance directive and durable POA but needs to revise his AD because his late wife had been his POA and he has recently remarried.  CH relayed that pt.'s current spouse would be his surrogate medical decision maker by law, but also provided education re: advance directive document and left copy for pt.'s perusal.  Pt. will have staff page spriritual care if assistance is needed coordinating notarization of document.

## 2022-12-04 NOTE — Evaluation (Signed)
Physical Therapy Evaluation Patient Details Name: Richard Conrad MRN: [id number] DOB: [birth year] Today's Date: 12/04/2022  History of Present Illness  The pt is a 62 yo male presenting 8/2 with acute onset slurred speech, confusion, and L-sided weakness. 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 PMH includes: HTN and hypothyroidism.   Clinical Impression  Pt in bed upon arrival of PT, agreeable to evaluation at this time. Prior to admission the pt was independent with all mobility, active, retired, living with his wife. The pt now presents with limitations in functional mobility, dynamic stability, LUE coordination, and mild L inattention due to above dx, and will continue to benefit from skilled PT to address these deficits. The pt was able to complete all transfers wit minG after first sit-stand where he had posterior LOB and needed minA. The pt was able to complete unchallenged hallway ambulation with minG and good stability, needed increased assist with challenge and had single LOB on stairs due to missing L railing with LUE and then falling to the L. The pt will benefit from skilled PT to progress functional strength, stability, balance reactions, and coordination, but will be safe to return home with family supervision and OPPT follow up.          If plan is discharge home, recommend the following: A little help with walking and/or transfers;Assist for transportation;Help with stairs or ramp for entrance   Can travel by private vehicle        Equipment Recommendations  (shower seat)  Recommendations for Other Services       Functional Status Assessment Patient has had a recent decline in their functional status and demonstrates the ability to make significant improvements in function in a reasonable and predictable amount of time.     Precautions / Restrictions Precautions Precautions: Fall Precaution Comments: inattention to L Restrictions Weight Bearing Restrictions: No       Mobility  Bed Mobility Overal bed mobility: Needs Assistance Bed Mobility: Supine to Sit     Supine to sit: Supervision          Transfers Overall transfer level: Needs assistance Equipment used: None Transfers: Sit to/from Stand Sit to Stand: Min assist           General transfer comment: minA intially, minG on future attempts. pt with posterior LOB initially that was not repeated    Ambulation/Gait Ambulation/Gait assistance: Min assist, Min guard Gait Distance (Feet): 250 Feet Assistive device: None Gait Pattern/deviations: Step-through pattern, Drifts right/left Gait velocity: decreased Gait velocity interpretation: <1.31 ft/sec, indicative of household ambulator   General Gait Details: pt with mild inattention to L, single LOB while trying to reach for L handrail on stairs, missed, and fell towards his L. min-modA to correct. no overt LOB with unchallenged gait.  Stairs Stairs: Yes Stairs assistance: Min assist Stair Management: One rail Left, Forwards, Step to pattern Number of Stairs: 3 General stair comments: misseed hand rail on L and fell towards L, min-modA to correct. then able to navigate steps with minG  Modified Rankin (Stroke Patients Only) Modified Rankin (Stroke Patients Only) Pre-Morbid Rankin Score: No symptoms Modified Rankin: Moderately severe disability     Balance Overall balance assessment: Needs assistance Sitting-balance support: No upper extremity supported, Feet supported Sitting balance-Leahy Scale: Good     Standing balance support: No upper extremity supported, During functional activity Standing balance-Leahy Scale: Fair Standing balance comment: poor tolerance for challenge  High level balance activites: Direction changes, Turns, Sudden stops, Head turns High Level Balance Comments: pt noted to have LOB on the left side with attempt to reach for L rail attempting steps. pt brushing therapist walkign on  the L side Standardized Balance Assessment Standardized Balance Assessment : Dynamic Gait Index   Dynamic Gait Index Level Surface: Normal Change in Gait Speed: Mild Impairment Gait with Horizontal Head Turns: Mild Impairment Step Over Obstacle: Mild Impairment Steps: Mild Impairment       Pertinent Vitals/Pain Pain Assessment Pain Assessment: No/denies pain    Home Living Family/patient expects to be discharged to:: Private residence Living Arrangements: Spouse/significant other Available Help at Discharge: Available 24 hours/day;Family Type of Home: House Home Access: Stairs to enter Entrance Stairs-Rails: Left Entrance Stairs-Number of Steps: 3   Home Layout: One level Home Equipment: Cane - single point;Hand held shower head Additional Comments: paula is wife, he has 14 grandchildren, 6 children ( 4biological, 1 adopted and 1 step) normally works out every morning    Prior Function Prior Level of Function : Independent/Modified Independent;Driving             Mobility Comments: working out, independent ADLs Comments: independent, retired     Higher education careers adviser Dominance   Dominant Hand: Right    Extremity/Trunk Assessment   Upper Extremity Assessment Upper Extremity Assessment: Defer to OT evaluation LUE Deficits / Details: decrease coordintation. pt with wfl grasp, pt denies any numbness or changes in sensation LUE Coordination: decreased gross motor    Lower Extremity Assessment Lower Extremity Assessment: LLE deficits/detail LLE Deficits / Details: mildly decreased corrdination, good strength throughout LLE Sensation: WNL LLE Coordination: decreased fine motor    Cervical / Trunk Assessment Cervical / Trunk Assessment: Normal  Communication   Communication: No difficulties  Cognition Arousal/Alertness: Awake/alert Behavior During Therapy: WFL for tasks assessed/performed Overall Cognitive Status: Impaired/Different from baseline Area of Impairment:  Safety/judgement, Awareness                         Safety/Judgement: Decreased awareness of safety, Decreased awareness of deficits Awareness: Anticipatory   General Comments: pt with L side weakness and needs visual attention to sustain attention to holding a cup. pt pouring drink in lap and shocked when the cold liquid hits his skin.        General Comments General comments (skin integrity, edema, etc.): VSS, pt with improved performance with cues for visual attention and redirection to task        Assessment/Plan    PT Assessment Patient needs continued PT services  PT Problem List Decreased strength;Decreased activity tolerance;Decreased balance;Decreased mobility;Decreased coordination       PT Treatment Interventions DME instruction;Gait training;Stair training;Functional mobility training;Therapeutic activities;Balance training;Therapeutic exercise;Neuromuscular re-education    PT Goals (Current goals can be found in the Care Plan section)  Acute Rehab PT Goals Patient Stated Goal: return home PT Goal Formulation: With patient/family Time For Goal Achievement: 12/18/22 Potential to Achieve Goals: Good    Frequency Min 1X/week        AM-PAC PT "6 Clicks" Mobility  Outcome Measure Help needed turning from your back to your side while in a flat bed without using bedrails?: None Help needed moving from lying on your back to sitting on the side of a flat bed without using bedrails?: None Help needed moving to and from a bed to a chair (including a wheelchair)?: A Little Help needed standing up from a chair using your  arms (e.g., wheelchair or bedside chair)?: A Little Help needed to walk in hospital room?: A Little Help needed climbing 3-5 steps with a railing? : A Little 6 Click Score: 20    End of Session Equipment Utilized During Treatment: Gait belt Activity Tolerance: Patient tolerated treatment well Patient left: in chair;with call bell/phone  within reach;with chair alarm set;with family/visitor present Nurse Communication: Mobility status PT Visit Diagnosis: Unsteadiness on feet (R26.81);Other abnormalities of gait and mobility (R26.89);Hemiplegia and hemiparesis Hemiplegia - Right/Left: Left Hemiplegia - dominant/non-dominant: Non-dominant Hemiplegia - caused by: Nontraumatic intracerebral hemorrhage    Time: 1028-1110 PT Time Calculation (min) (ACUTE ONLY): 42 min   Charges:   PT Evaluation $PT Eval Low Complexity: 1 Low   PT General Charges $$ ACUTE PT VISIT: 1 Visit         [name], PT, DPT   Acute Rehabilitation Department Office [phone] Secure Chat Communication Preferred  [name] 12/04/2022, 12:52 PM

## 2022-12-04 NOTE — Progress Notes (Signed)
PT Cancellation Note  Patient Details Name: Richard Conrad MRN: [id number] DOB: January 30, 1961   Cancelled Treatment:    Reason Eval/Treat Not Completed: Active bedrest order remains this morning that is not set to expire until 14:37 today. PT will continue to follow but hold on initial evaluation until after bedrest expires or until otherwise directed by MD.   [name], PT, DPT   Acute Rehabilitation Department Office [phone] Secure Chat Communication Preferred  [name] 12/04/2022, 8:10 AM

## 2022-12-04 NOTE — Evaluation (Signed)
Speech Language Pathology Evaluation Patient Details Name: Richard Conrad MRN: [id number] DOB: 1960-08-18 Today's Date: 12/04/2022 Time: 4098-1191 SLP Time Calculation (min) (ACUTE ONLY): 19 min  Problem List:  Patient Active Problem List   Diagnosis Date Noted   Stroke, hemorrhagic (HCC) 12/03/2022   Closed fracture of right distal radius 10/13/2021   Dyslipidemia 04/08/2021   Hyperglycemia 04/08/2021   Benign essential HTN 04/11/2020   Hypothyroidism (acquired) 04/11/2020   Gastroesophageal reflux disease without esophagitis 12/06/2017   Seasonal allergic rhinitis 12/06/2017   Colon polyp, hyperplastic 03/23/2016   Past Medical History:  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" (03/23/2016)   History of kidney stones    "passed them"   Hypertension    Hypothyroidism    Iron deficiency anemia    Seasonal allergies    "fall and spring; mild" (03/23/2016)   Past Surgical History:  Past Surgical History:  Procedure Laterality Date   APPENDECTOMY  03/23/2016   COLON RESECTION N/A 03/23/2016   Procedure: LAPAROSCOPIC CECECTOMY;  Surgeon: Abigail Miyamoto, MD;  Location: [facility] OR;  Service: General;  Laterality: N/A;   COLON SURGERY  03/23/2016   LAPAROSCOPIC CECECTOMY     COLONOSCOPY     ESOPHAGOGASTRODUODENOSCOPY ENDOSCOPY     HARDWARE REMOVAL Right 01/21/2022   Procedure: HARDWARE REMOVAL WRIST;  Surgeon: Marlyne Beards, MD;  Location: [facility] OR;  Service: Orthopedics;  Laterality: Right;  60   OPEN REDUCTION INTERNAL FIXATION (ORIF) DISTAL RADIAL FRACTURE Right 10/21/2021   Procedure: RIGHT OPEN REDUCTION INTERNAL FIXATION (ORIF) DISTAL RADIAL FRACTURE;  Surgeon: Marlyne Beards, MD;  Location: [facility] OR;  Service: Orthopedics;  Laterality: Right;   RADIAL ARTERY ANEURYSM REPAIR  2019   TONSILLECTOMY     VASECTOMY     HPI:  Richard Conrad is a 62 y.o. male with history  of hypothyroidism and hypertension who presents with sudden onset slurred speech, confusion, left-sided facial droop and left-sided weakness. Patient states that he had gone to the gym earlier today to lift weights and had had a headache throughout the morning. At about 10:00, he was going to meet a friend for lunch when he had sudden onset confusion and left-sided weakness. His friend called EMS and he was brought to the hospital. 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 Due to early signs of midline shift being present, hypertonic saline was started. Patient states that at home, he is blood pressure is normally under control. On Saturday, he had an episode of vertigo and nausea, but this resolved spontaneously.ST consulted for BSE to assess swallow function and SLE for speech/language assessment.   Assessment / Plan / Recommendation Clinical Impression  Pt assessed via the [employer] Mental Status Examination (SLUMS) with a score obtained of 30/30 indicating adequate cognitive functioning; however, decreased sustained attention to functional tasks (ie: pouring drink/overflowing, simple problem solving with ambulation) noted with decreased awareness of deficits on L and flat overall affect during various tasks during assessment.  Pt able to answer questions appropriately, follow various commands and oriented x4.  Naming/reading/simple graphic expression all WFL.  Memory recall/storage appear adequate.  OME revealed L facial asymmetry and decreased sensation on L/decreased awareness with liquids via cup/solid consumption.  Recommend ST f/u in acute setting for cognitive changes/education re: R CVA with pt/family and safety precautions required to increase safety awareness. Recommend  OP SLP for cognitive rehab/dysphagia tx.  Thank you for this consult.  SLP Assessment  SLP Recommendation/Assessment: Patient needs continued Speech Lanaguage Pathology Services SLP Visit Diagnosis:  Attention and concentration deficit;Cognitive communication deficit (R41.841) Attention and concentration deficit following: Other Nontraumatic ICH    Recommendations for follow up therapy are one component of a multi-disciplinary discharge planning process, led by the attending physician.  Recommendations may be updated based on patient status, additional functional criteria and insurance authorization.    Follow Up Recommendations  Outpatient SLP    Assistance Recommended at Discharge  Intermittent Supervision/Assistance  Functional Status Assessment Patient has had a recent decline in their functional status and demonstrates the ability to make significant improvements in function in a reasonable and predictable amount of time.  Frequency and Duration min 2x/week  1 week      SLP Evaluation Cognition  Overall Cognitive Status: Impaired/Different from baseline Orientation Level: Oriented X4 Year: 2024 Month: August Day of Week: Correct Attention: Sustained Sustained Attention: Impaired Sustained Attention Impairment: Functional basic;Verbal basic Memory: Appears intact Awareness: Impaired Awareness Impairment: Intellectual impairment;Anticipatory impairment Problem Solving: Impaired Problem Solving Impairment: Verbal basic;Functional basic Executive Function: Reasoning Reasoning: Impaired Reasoning Impairment: Verbal basic;Functional basic Behaviors: Impulsive Safety/Judgment: Impaired Comments: held cup in left hand and poured drink to top/spilled with limited awareness       Comprehension  Auditory Comprehension Overall Auditory Comprehension: Appears within functional limits for tasks assessed Yes/No Questions: Within Functional Limits Commands: Within Functional Limits Conversation: Simple Interfering Components: Attention Visual Recognition/Discrimination Discrimination: Within Function Limits Reading Comprehension Reading Status: Within funtional limits     Expression Expression Primary Mode of Expression: Verbal Verbal Expression Overall Verbal Expression: Appears within functional limits for tasks assessed Level of Generative/Spontaneous Verbalization: Conversation Repetition: No impairment Naming: No impairment Pragmatics: Impairment Impairments: Abnormal affect Interfering Components: Attention Non-Verbal Means of Communication: Not applicable Written Expression Dominant Hand: Right Written Expression: Not tested   Oral / Motor  Oral Motor/Sensory Function Overall Oral Motor/Sensory Function: Mild impairment Facial Symmetry: Abnormal symmetry left Facial Sensation: Reduced left Lingual Symmetry: Within Functional Limits Lingual Strength: Within Functional Limits Motor Speech Overall Motor Speech: Appears within functional limits for tasks assessed Respiration: Within functional limits Phonation: Normal Resonance: Within functional limits Articulation: Within functional limitis Intelligibility: Intelligible Motor Planning: Witnin functional limits Motor Speech Errors: Not applicable            Pat Janyce Ellinger,M.S., CCC-SLP 12/04/2022, 1:19 PM

## 2022-12-04 NOTE — Progress Notes (Addendum)
STROKE TEAM PROGRESS NOTE   BRIEF HPI [name] is a 62 y.o. male with history of hypertension and hypothyroidism presenting with sudden onset slurred speech, confusion, left-sided facial droop and left-sided weakness with headache.  Patient was brought to the hospital and was found to have an 11 cc ICH in right basal ganglia.  He did have early midline shift, so hypertonic saline was started.  However, his neurological exam is improved today and midline shift is stable on CT, so we will discontinue hypertonic saline.   SIGNIFICANT HOSPITAL EVENTS 8/2 admitted to ICU, hypertonic saline started 8/3 hypertonic saline discontinued  INTERIM HISTORY/SUBJECTIVE Patient is seen in his room with his wife at the bedside.  His neurological exam is improved today, and he is hemodynamically stable.  His blood pressure has been stable off Cleviprex.   OBJECTIVE  CBC    Component Value Date/Time   WBC 7.7 12/04/2022 0822   RBC 3.60 (L) 12/04/2022 0822   HGB 11.5 (L) 12/04/2022 0822   HCT 34.7 (L) 12/04/2022 0822   PLT 296 12/04/2022 0822   MCV 96.4 12/04/2022 0822   MCH 31.9 12/04/2022 0822   MCHC 33.1 12/04/2022 0822   RDW 13.7 12/04/2022 0822   LYMPHSABS 1.3 12/03/2022 1404   MONOABS 0.6 12/03/2022 1404   EOSABS 0.0 12/03/2022 1404   BASOSABS 0.0 12/03/2022 1404    BMET    Component Value Date/Time   NA 143 12/04/2022 0822   K 3.7 12/04/2022 0822   CL 112 (H) 12/04/2022 0822   CO2 24 12/04/2022 0822   GLUCOSE 102 (H) 12/04/2022 0822   BUN 12 12/04/2022 0822   CREATININE 0.95 12/04/2022 0822   CREATININE 1.03 04/11/2020 1418   CALCIUM 8.6 (L) 12/04/2022 0822   GFRNONAA >60 12/04/2022 0822   GFRNONAA 80 04/11/2020 1418    IMAGING past 24 hours MR BRAIN W WO CONTRAST  Result Date: 12/04/2022 CLINICAL DATA:  Hemorrhagic stroke.  Hypertension EXAM: MRI HEAD WITHOUT AND WITH CONTRAST TECHNIQUE: Multiplanar, multiecho pulse sequences of the brain and surrounding structures  were obtained without and with intravenous contrast. CONTRAST:  7mL GADAVIST GADOBUTROL 1 MMOL/ML IV SOLN COMPARISON:  Head CT CTA from yesterday FINDINGS: Brain: Unchanged size and shape of the acute hemorrhage centered at the right basal ganglia involving insula and corona radiata with the dominant component measuring up to 4 x 1.3 cm. A rim of edema is present as expected without evidence of underlying infarct or mass. Small FLAIR hyperintensities scattered in the cerebral white matter, overall mild in extent. Single chronic microhemorrhage is seen in the deep left cerebral white matter. Vascular: Major flow voids and vascular enhancements are preserved. Skull and upper cervical spine: Normal marrow signal Sinuses/Orbits: Negative IMPRESSION: 1. No infarct or mass seen underlying the right cerebral ICH. 2. 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 Electronically Signed   By: Tiburcio Pea M.D.   On: 12/04/2022 04:04   CT HEAD WO CONTRAST  Result Date: 12/03/2022 CLINICAL DATA:  Hemorrhagic infarct EXAM: CT HEAD WITHOUT CONTRAST TECHNIQUE: Contiguous axial images were obtained from the base of the skull through the vertex without intravenous contrast. RADIATION DOSE REDUCTION: This exam was performed according to the departmental dose-optimization program which includes automated exposure control, adjustment of the mA and/or kV according to patient size and/or use of iterative reconstruction technique. COMPARISON:  None Available. FINDINGS: Brain: Intraparenchymal hematoma within the right basal ganglia is stable measuring  2.5 x 3.0 x 4.1 cm when measured in similar fashion. Mild associated mass effect with effacement of the right lateral ventricle and 3 mm right to left midline shift is unchanged. No evidence of ventricular entrapment. No interval hemorrhage. No extra-axial fluid collection. No intraventricular extension of  hemorrhage. Ventricular size is normal. Cerebellum is unremarkable. Vascular: No hyperdense vessel or unexpected calcification. Skull: Normal. Negative for fracture or focal lesion. Sinuses/Orbits: No acute finding. Other: Mastoid air cells and middle ear cavities are clear. IMPRESSION: 1. Stable examination. Stable intraparenchymal hematoma within the right basal ganglia with associated mass effect and 3 mm right to left midline shift. No evidence of intraventricular extension or interval hemorrhage. Electronically Signed   By: Helyn Numbers M.D.   On: 12/03/2022 21:08   CT ANGIO HEAD NECK W WO CM (CODE STROKE)  Result Date: 12/03/2022 CLINICAL DATA:  Neuro deficit, acute, stroke suspected EXAM: CT ANGIOGRAPHY HEAD AND NECK WITH AND WITHOUT CONTRAST TECHNIQUE: Multidetector CT imaging of the head and neck was performed using the standard protocol during bolus administration of intravenous contrast. Multiplanar CT image reconstructions and MIPs were obtained to evaluate the vascular anatomy. Carotid stenosis measurements (when applicable) are obtained utilizing NASCET criteria, using the distal internal carotid diameter as the denominator. RADIATION DOSE REDUCTION: This exam was performed according to the departmental dose-optimization program which includes automated exposure control, adjustment of the mA and/or kV according to patient size and/or use of iterative reconstruction technique. CONTRAST:  75mL OMNIPAQUE IOHEXOL 350 MG/ML SOLN COMPARISON:  Same day CT head. FINDINGS: CTA NECK FINDINGS Aortic arch: Great vessel origins are patent. Right carotid system: No evidence of dissection, stenosis (50% or greater), or occlusion. Left carotid system: No evidence of dissection, stenosis (50% or greater), or occlusion. Vertebral arteries: Right dominant. No evidence of dissection, stenosis (50% or greater), or occlusion. Skeleton: Negative. Other neck: Negative. Upper chest: Visualized lung apices are clear.  Review of the MIP images confirms the above findings CTA HEAD FINDINGS Anterior circulation: Bilateral intracranial ICAs, MCAs and ACAs are patent without proximal hemodynamically significant stenosis. No aneurysm or arteriovenous malformation identified in the region of acute hemorrhage; however, acute blood products does limit assessment. Posterior circulation: Bilateral intradural vertebral arteries, basilar artery and both posterior cerebral arteries are patent without proximal hemodynamically significant stenosis. Left fetal type PCA, anatomic variant. Venous sinuses: As permitted by contrast timing, patent. Review of the MIP images confirms the above findings IMPRESSION: 1. No aneurysm or arteriovenous malformation identified in the region of acute hemorrhage; however, acute blood products does limit assessment. 2. No emergent large vessel occlusion or proximal hemodynamically significant stenosis. Electronically Signed   By: Feliberto Harts M.D.   On: 12/03/2022 14:27   CT HEAD CODE STROKE WO CONTRAST  Result Date: 12/03/2022 CLINICAL DATA:  Code stroke. EXAM: CT HEAD WITHOUT CONTRAST TECHNIQUE: Contiguous axial images were obtained from the base of the skull through the vertex without intravenous contrast. RADIATION DOSE REDUCTION: This exam was performed according to the departmental dose-optimization program which includes automated exposure control, adjustment of the mA and/or kV according to patient size and/or use of iterative reconstruction technique. COMPARISON:  None Available. FINDINGS: Brain: There is a 2.2 x 3.7 x 2.6 cm (~ 11 cc) parenchymal hematoma centered around the right basal ganglia and insula. There is no evidence of intraventricular extension. Minimal leftward midline shift measuring up to 2 mm. No hydrocephalus. No CT evidence of an acute cortical infarct. Vascular: No hyperdense vessel or unexpected calcification.  Skull: Normal. Negative for fracture or focal lesion.  Sinuses/Orbits: No acute finding. Other: None. ASPECTS [facility] Stroke Program Early CT Score) IMPRESSION: Acute parenchymal hematoma centered around the right basal ganglia and insula measuring up to 2.2 x 3.7 x 2.6 cm. No evidence of intraventricular extension. Minimal leftward midline shift measuring up to 2 mm. Findings were paged to Dr. Pearlean Brownie on 12/03/22 at 2:17 PM via [facility] paging system. Electronically Signed   By: Lorenza Cambridge M.D.   On: 12/03/2022 14:18    Vitals:   12/04/22 0700 12/04/22 0800 12/04/22 0900 12/04/22 1000  BP: (!) 141/87 138/81 134/85 (!) 142/95  Pulse: 71 67 75 78  Resp: 13 12 14 20   Temp:  98.3 F (36.8 C)    TempSrc:  Oral    SpO2: 98% 97% 97% 98%  Weight:      Height:         PHYSICAL EXAM General:  Alert, well-nourished, well-developed patient in no acute distress Psych:  Mood and affect appropriate for situation CV: Regular rate and rhythm on monitor Respiratory:  Regular, unlabored respirations on room air   NEURO:  Mental Status: AA&Ox3, patient is able to give clear and coherent history Speech/Language: speech is without dysarthria or aphasia.  Naming, repetition, fluency, and comprehension intact.  Cranial Nerves:  II: PERRL.  III, IV, VI: EOMI. Eyelids elevate symmetrically.  V: Sensation is intact to light touch and symmetrical to face.  VII: Left facial droop VIII: hearing intact to voice. IX, X: Phonation is normal.  GM:[id number] shrug 5/5. XII: tongue is midline without fasciculations. Motor: 5/5 strength to all muscle groups tested with decreased grip strength on the left Tone: is normal and bulk is normal Sensation- Intact to light touch bilaterally. Extinction absent to light touch to DSS.   Coordination: FTN intact bilaterally.subtle pronator drift in left arm Gait- deferred   ASSESSMENT/Conrad  Intraparenchymal Hemorrhage:  right BG IPH, etiology: Likely hypertensive Code Stroke CT head acute IPH to right basal ganglia measuring  11 cc CTA head & neck no emergent LVO, no aneurysm or AVM underlying IPH MRI unchanged right basal ganglia IPH with no aneurysm or mass underlying 2D Echo pending LDL 157 HgbA1c 5.7 VTE prophylaxis -heparin subcu No antithrombotic prior to admission, now on No antithrombotic secondary to IPH Therapy recommendations: Pending Disposition: Pending  Cerebral edema (brain compression) Localized edema with 3 mm midline shift seen on initial CT Hypertonic saline started 8/2 at 75 cc/h Edema remained stable on imaging 8/3 and neurological exam is improved so we will discontinue hypertonic saline  Hypertension Home meds: Amlodipine 5 mg daily Stable Off cleviprex On home amlodipine  Blood Pressure Goal: SBP less than 160  Long-term BP goal normotensive  Hyperlipidemia Home meds: None LDL 157, goal < 70 Add high intensity statin at discharge  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 Advance diet as tolerated  Other Stroke Risk Factors None   Other Active Problems Hypothyroidism, resume Synthroid  Hospital day # 1  Richard Conrad , MSN, AGACNP-BC [facility] See Amion for schedule and pager information 12/04/2022 10:50 AM  ATTENDING NOTE: I reviewed above note and agree with the assessment and Conrad. Pt was seen and examined.   Wife at bedside.  Patient lying bed, left hemiparesis has improved, still has mild left facial droop.  Passed a swallow, on diet.  MRI this morning showed stable hematoma and mild midline shift.  Will  DC 3% saline, resume home BP meds.  BP goal less than 160, PT OT pending.  For detailed assessment and Conrad, please refer to above/below as I have made changes wherever appropriate.   Richard Plan, MD PhD Stroke Neurology 12/04/2022 11:25 AM  This patient is critically ill due to right BG ICH, hypertensive emergency, cerebral edema and at significant risk of neurological worsening, death  form hematoma expansion, brain herniation, hypertensive encephalopathy. This patient's care requires constant monitoring of vital signs, hemodynamics, respiratory and cardiac monitoring, review of multiple databases, neurological assessment, discussion with family, other specialists and medical decision making of high complexity. I spent 40 minutes of neurocritical care time in the care of this patient. I had long discussion with wife and patient at bedside, updated pt current condition, treatment Conrad and potential prognosis, and answered all the questions.  They expressed understanding and appreciation.        To contact Stroke Continuity provider, please refer to [email or web address]. After hours, contact General Neurology

## 2022-12-04 NOTE — Progress Notes (Signed)
*  PRELIMINARY RESULTS* Echocardiogram 2D Echocardiogram has been performed.  Richard Conrad 12/04/2022, 1:45 PM

## 2022-12-04 NOTE — Evaluation (Signed)
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" (03/23/2016)   History of kidney stones    "passed them"   Hypertension    Hypothyroidism    Iron deficiency anemia    Seasonal allergies    "fall and spring; mild" (03/23/2016)   Past Surgical History:  Past Surgical History:  Procedure Laterality Date   APPENDECTOMY  03/23/2016   COLON RESECTION N/A 03/23/2016   Procedure: LAPAROSCOPIC CECECTOMY;  Surgeon: Abigail Miyamoto, MD;  Location: [facility] OR;  Service: General;  Laterality: N/A;   COLON SURGERY  03/23/2016   LAPAROSCOPIC CECECTOMY     COLONOSCOPY     ESOPHAGOGASTRODUODENOSCOPY ENDOSCOPY     HARDWARE REMOVAL Right 01/21/2022   Procedure: HARDWARE REMOVAL WRIST;  Surgeon: Marlyne Beards, MD;  Location: [facility] OR;  Service: Orthopedics;  Laterality: Right;  60   OPEN REDUCTION INTERNAL FIXATION (ORIF) DISTAL RADIAL FRACTURE Right 10/21/2021   Procedure: RIGHT OPEN REDUCTION INTERNAL FIXATION (ORIF) DISTAL RADIAL FRACTURE;  Surgeon: Marlyne Beards, MD;  Location: [facility] OR;  Service: Orthopedics;  Laterality: Right;   RADIAL ARTERY ANEURYSM REPAIR  2019   TONSILLECTOMY     VASECTOMY     HPI:  Richard Conrad is a 62 y.o. male with history of hypothyroidism and hypertension who presents with sudden onset slurred speech, confusion, left-sided facial droop and left-sided weakness.  Patient states that he had gone to the gym earlier today to lift weights and had had a headache throughout the morning.  At about 10:00, he was going to meet a friend for lunch when he had sudden onset confusion and left-sided  weakness.  His friend called EMS and he was brought to the hospital.  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  Due to early signs of midline shift being present, hypertonic saline was started.  Patient states that at home, he is blood pressure is normally under control.  On Saturday, he had an episode of vertigo and nausea, but this resolved spontaneously.ST consulted for BSE to assess swallow function and SLE for speech/language assessment.    Assessment / Plan / Recommendation  Clinical Impression  Pt seen for clinical swallow evaluation with slight L facial asymmetry noted at rest and with decreased awareness of sensory input during mastication of solids/slight oral residue with liquids via cup without verbal cues provided by SLP.  Pt able to consume thin via small sips without any overt s/sx of aspiration present, but did exhibit a delayed cough with successive swallows of thin with question of inattention impacting consumption vs L weakness.  Pt's vocal quality remained adequate throughout assessment and pt/family informed SLP of hx of frequent throat clearing and EoE reported in family hx, so this may be a contributing factor to be ruled out during acute stay if family wishes.  Recommend initiating Dysphagia 3/thin liquid diet d/t potential inattention/decreased sensory awareness.  ST will f/u for diet tolerance/advancement and dysphagia management/tx.  Thank you for this consult. 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  Recommendations may be updated based on patient status, additional functional criteria and insurance authorization.  Follow up Recommendations Follow physician's recommendations for discharge plan and follow up therapies      Assistance Recommended at Discharge  TBD  Functional Status Assessment Patient has had a recent decline in their functional status and demonstrates the ability to make significant improvements in function in a reasonable and predictable amount of time.  Frequency and Duration min 2x/week  1 week       Prognosis Prognosis for improved oropharyngeal function: Good      Swallow Study   General Date of Onset: 12/03/22 HPI: Richard Conrad is a 62 y.o. male with history of hypothyroidism and hypertension who presents with sudden onset slurred speech, confusion, left-sided facial droop and left-sided weakness.  Patient states that he had gone to the gym earlier today to lift weights and had had a headache throughout the morning.  At about 10:00, he was going to meet a friend for lunch when he had sudden onset confusion and left-sided weakness.  His friend called EMS and he was brought to the hospital.  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  Due to early signs of midline shift being present, hypertonic saline was started.  Patient states that at home, he is blood pressure is normally under control.  On Saturday, he had an episode of vertigo and nausea, but this resolved spontaneously.ST consulted for BSE to assess swallow function and SLE for speech/language assessment. Type of Study: Bedside Swallow Evaluation Previous Swallow Assessment: [place] failed with consecutive swallow with 3 oz water Diet Prior to this Study: NPO Temperature Spikes Noted: No Respiratory Status: Room air History of Recent Intubation: No Behavior/Cognition:  Alert;Cooperative Oral Cavity Assessment: Within Functional Limits Oral Care Completed by SLP: No (recent completion by pt; deferred) Oral Cavity - Dentition: Adequate natural dentition Vision: Functional for self-feeding Self-Feeding Abilities: Able to feed self Patient Positioning: Upright in bed Baseline Vocal Quality: Normal Volitional Cough: Strong Volitional Swallow: Able to elicit    Oral/Motor/Sensory Function Overall Oral Motor/Sensory Function: Mild impairment Facial Symmetry: Abnormal symmetry left Facial Sensation: Reduced left Lingual Symmetry: Within Functional Limits Lingual Strength: Within Functional Limits   Ice Chips Ice chips: Within functional limits Presentation: Self Fed;Spoon   Thin Liquid Thin Liquid: Impaired Presentation: Cup;Spoon;Straw;Self Fed Pharyngeal  Phase Impairments: Cough - Delayed (with larger volume only; hx of throat clearing)    Nectar Thick Nectar Thick Liquid: Not tested   Honey Thick Honey Thick Liquid: Not tested   Puree Puree: Within functional limits Presentation: Self Fed   Solid     Solid: Impaired Presentation: Self Fed Other Comments: slight L sensory impairment; unaware      Pat Cartez Mogle,M.S., CCC-SLP 12/04/2022,12:50 PM

## 2022-12-05 ENCOUNTER — Inpatient Hospital Stay (HOSPITAL_COMMUNITY): Payer: Self-pay

## 2022-12-05 DIAGNOSIS — I619 Nontraumatic intracerebral hemorrhage, unspecified: Secondary | ICD-10-CM

## 2022-12-05 LAB — BASIC METABOLIC PANEL WITH GFR
Anion gap: 7 (ref 5–15)
BUN: 18 mg/dL (ref 8–23)
CO2: 25 mmol/L (ref 22–32)
Calcium: 8.7 mg/dL — ABNORMAL LOW (ref 8.9–10.3)
Chloride: 105 mmol/L (ref 98–111)
Creatinine, Ser: 1.02 mg/dL (ref 0.61–1.24)
GFR, Estimated: >60 mL/min (ref >60)
Glucose, Bld: 84 mg/dL (ref 70–99)
Potassium: 3.3 mmol/L — ABNORMAL LOW (ref 3.5–5.1)
Sodium: 137 mmol/L (ref 135–145)

## 2022-12-05 LAB — CBC
HCT: 34.3 % — ABNORMAL LOW (ref 39.0–52.0)
Hemoglobin: 11.3 g/dL — ABNORMAL LOW (ref 13.0–17.0)
MCH: 31.5 pg (ref 26.0–34.0)
MCHC: 32.9 g/dL (ref 30.0–36.0)
MCV: 95.5 fL (ref 80.0–100.0)
Platelets: 297 10*3/uL (ref 150–400)
RBC: 3.59 MIL/uL — ABNORMAL LOW (ref 4.22–5.81)
RDW: 13.6 % (ref 11.5–15.5)
WBC: 9.1 10*3/uL (ref 4.0–10.5)
nRBC: 0 % (ref 0.0–0.2)

## 2022-12-05 MED ORDER — POTASSIUM CHLORIDE CRYS ER 20 MEQ PO TBCR
40.0000 meq | EXTENDED_RELEASE_TABLET | Freq: Once | ORAL | Status: AC
  Administered 2022-12-05: 40 meq via ORAL
  Filled 2022-12-05: qty 2

## 2022-12-05 MED ORDER — ROSUVASTATIN CALCIUM 20 MG PO TABS
20.0000 mg | ORAL_TABLET | Freq: Every day | ORAL | Status: DC
  Filled 2022-12-05: qty 1

## 2022-12-05 NOTE — Discharge Summary (Shared)
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***       LABORATORY STUDIES CBC    Component Value Date/Time   WBC 9.1 12/05/2022 0035   RBC 3.59 (L) 12/05/2022 0035   HGB 11.3 (L) 12/05/2022 0035   HCT 34.3 (L) 12/05/2022 0035   PLT 297 12/05/2022 0035   MCV 95.5 12/05/2022 0035   MCH 31.5 12/05/2022 0035   MCHC 32.9 12/05/2022 0035   RDW 13.6 12/05/2022 0035   LYMPHSABS 1.3 12/03/2022 1404   MONOABS 0.6 12/03/2022 1404   EOSABS 0.0 12/03/2022 1404   BASOSABS 0.0 12/03/2022 1404   CMP    Component Value Date/Time   NA 137 12/05/2022 0035   K 3.3 (L) 12/05/2022 0035   CL 105 12/05/2022 0035   CO2 25 12/05/2022 0035   GLUCOSE 84 12/05/2022 0035   BUN 18 12/05/2022 0035   CREATININE 1.02 12/05/2022 0035   CREATININE 1.03 04/11/2020 1418   CALCIUM 8.7 (L) 12/05/2022 0035   PROT 7.0 12/03/2022 1404   ALBUMIN 4.0 12/03/2022 1404   AST 26 12/03/2022 1404   ALT 21 12/03/2022 1404   ALKPHOS 50 12/03/2022 1404   BILITOT 1.2 12/03/2022 1404   GFRNONAA >60 12/05/2022 0035   GFRNONAA 80 04/11/2020 1418   GFRAA 92 04/11/2020 1418   COAGS Lab Results  Component Value Date   INR 1.0 12/03/2022   Lipid Panel    Component Value Date/Time   CHOL 226 (H) 12/04/2022 0227   TRIG 50 12/04/2022 0227   HDL 59 12/04/2022 0227   CHOLHDL 3.8 12/04/2022 0227   VLDL 10 12/04/2022 0227   LDLCALC 157 (H) 12/04/2022 0227   LDLCALC 113 (H) 04/11/2020 1418   HgbA1C  Lab Results   Component Value Date   HGBA1C 5.7 04/07/2021   Urine Drug Screen negative Alcohol Level    Component Value Date/Time   ETH <10 12/03/2022 1400     SIGNIFICANT DIAGNOSTIC STUDIES ECHOCARDIOGRAM COMPLETE  Result Date: 12/04/2022    ECHOCARDIOGRAM REPORT   Patient Name:   Richard Conrad Date of Exam: 12/04/2022 Medical Rec #:  [id number]    Height:       69.0 in Accession #:    [id number]   Weight:       138.0 lb Date of Birth:  05-Nov-1960    BSA:          1.765 m Patient Age:    62 years     BP:           141/91 mmHg Patient Gender: M            HR:           73 bpm. Exam Location:  Inpatient Procedure: 2D Echo, Cardiac Doppler and Color Doppler Indications:     Stroke I63.9  History:         Patient has no prior history of Echocardiogram examinations.  Stroke; Risk Factors:Non-Smoker and Dyslipidemia.  Sonographer:     Dondra Prader RVT RCS Referring Phys:  [id number] Lennox Solders DE LA TORRE Diagnosing Phys: Weston Brass MD IMPRESSIONS  1. Left ventricular ejection fraction, by estimation, is 50 to 55%. The left ventricle has low normal function. The left ventricle has no regional wall motion abnormalities. Left ventricular diastolic parameters were normal.  2. Right ventricular systolic function is normal. The right ventricular size is mildly enlarged. There is normal pulmonary artery systolic pressure. The estimated right ventricular systolic pressure is 29.9 mmHg.  3. Left atrial size was mildly dilated.  4. The mitral valve is normal in structure. Trivial mitral valve regurgitation. No evidence of mitral stenosis.  5. The aortic valve is tricuspid. Aortic valve regurgitation is mild. No aortic stenosis is present.  6. Aortic dilatation noted. There is borderline dilatation of the ascending aorta, measuring 40 mm.  7. The inferior vena cava is dilated in size with >50% respiratory variability, suggesting right atrial pressure of 8 mmHg. Conclusion(s)/Recommendation(s): No intracardiac source  of embolism detected on this transthoracic study. Consider a transesophageal echocardiogram to exclude cardiac source of embolism if clinically indicated. FINDINGS  Left Ventricle: Left ventricular ejection fraction, by estimation, is 50 to 55%. The left ventricle has low normal function. The left ventricle has no regional wall motion abnormalities. The left ventricular internal cavity size was normal in size. There is borderline left ventricular hypertrophy. Left ventricular diastolic parameters were normal. Right Ventricle: The right ventricular size is mildly enlarged. No increase in right ventricular wall thickness. Right ventricular systolic function is normal. There is normal pulmonary artery systolic pressure. The tricuspid regurgitant velocity is 2.34  m/s, and with an assumed right atrial pressure of 8 mmHg, the estimated right ventricular systolic pressure is 29.9 mmHg. Left Atrium: Left atrial size was mildly dilated. Right Atrium: Right atrial size was normal in size. Pericardium: There is no evidence of pericardial effusion. Mitral Valve: The mitral valve is normal in structure. Trivial mitral valve regurgitation. No evidence of mitral valve stenosis. Tricuspid Valve: The tricuspid valve is normal in structure. Tricuspid valve regurgitation is trivial. No evidence of tricuspid stenosis. Aortic Valve: The aortic valve is tricuspid. Aortic valve regurgitation is mild. No aortic stenosis is present. Aortic valve mean gradient measures 4.0 mmHg. Aortic valve peak gradient measures 6.6 mmHg. Aortic valve area, by VTI measures 2.91 cm. Pulmonic Valve: The pulmonic valve was normal in structure. Pulmonic valve regurgitation is trivial. No evidence of pulmonic stenosis. Aorta: Aortic dilatation noted. There is borderline dilatation of the ascending aorta, measuring 40 mm. Venous: The inferior vena cava is dilated in size with greater than 50% respiratory variability, suggesting right atrial pressure of 8 mmHg.  IAS/Shunts: No atrial level shunt detected by color flow Doppler.  LEFT VENTRICLE PLAX 2D LVIDd:         3.70 cm   Diastology LVIDs:         2.20 cm   LV e' medial:    10.30 cm/s LV PW:         1.10 cm   LV E/e' medial:  6.1 LV IVS:        0.90 cm   LV e' lateral:   15.10 cm/s LVOT diam:     2.00 cm   LV E/e' lateral: 4.1 LV SV:         79 LV SV Index:   45 LVOT Area:     3.14 cm  RIGHT VENTRICLE  IVC RV S prime:     10.40 cm/s  IVC diam: 2.10 cm TAPSE (M-mode): 2.5 cm LEFT ATRIUM             Index        RIGHT ATRIUM           Index LA diam:        3.30 cm 1.87 cm/m   RA Area:     14.00 cm LA Vol (A2C):   61.9 ml 35.08 ml/m  RA Volume:   33.60 ml  19.04 ml/m LA Vol (A4C):   60.2 ml 34.11 ml/m LA Biplane Vol: 62.0 ml 35.13 ml/m  AORTIC VALVE                    PULMONIC VALVE AV Area (Vmax):    2.90 cm     PV Vmax:       0.93 m/s AV Area (Vmean):   2.68 cm     PV Peak grad:  3.4 mmHg AV Area (VTI):     2.91 cm AV Vmax:           128.00 cm/s AV Vmean:          89.800 cm/s AV VTI:            0.270 m AV Peak Grad:      6.6 mmHg AV Mean Grad:      4.0 mmHg LVOT Vmax:         118.00 cm/s LVOT Vmean:        76.700 cm/s LVOT VTI:          0.250 m LVOT/AV VTI ratio: 0.93 AR Vena Contracta: 0.50 cm  AORTA Ao Root diam: 3.20 cm Ao Asc diam:  4.00 cm MITRAL VALVE               TRICUSPID VALVE MV Area (PHT): 3.85 cm    TR Peak grad:   21.9 mmHg MV Decel Time: 197 msec    TR Vmax:        234.00 cm/s MV E velocity: 62.50 cm/s MV A velocity: 58.80 cm/s  SHUNTS MV E/A ratio:  1.06        Systemic VTI:  0.25 m                            Systemic Diam: 2.00 cm Weston Brass MD Electronically signed by Weston Brass MD Signature Date/Time: 12/04/2022/4:02:01 PM    Final (Updated)    MR BRAIN W WO CONTRAST  Result Date: 12/04/2022 CLINICAL DATA:  Hemorrhagic stroke.  Hypertension EXAM: MRI HEAD WITHOUT AND WITH CONTRAST TECHNIQUE: Multiplanar, multiecho pulse sequences of the brain and surrounding structures  were obtained without and with intravenous contrast. CONTRAST:  7mL GADAVIST GADOBUTROL 1 MMOL/ML IV SOLN COMPARISON:  Head CT CTA from yesterday FINDINGS: Brain: Unchanged size and shape of the acute hemorrhage centered at the right basal ganglia involving insula and corona radiata with the dominant component measuring up to 4 x 1.3 cm. A rim of edema is present as expected without evidence of underlying infarct or mass. Small FLAIR hyperintensities scattered in the cerebral white matter, overall mild in extent. Single chronic microhemorrhage is seen in the deep left cerebral white matter. Vascular: Major flow voids and vascular enhancements are preserved. Skull and upper cervical spine: Normal marrow signal Sinuses/Orbits: Negative IMPRESSION: 1. No infarct or mass seen underlying the right cerebral ICH. 2. 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 Electronically Signed   By: Tiburcio Pea M.D.   On: 12/04/2022 04:04   CT HEAD WO CONTRAST  Result Date: 12/03/2022 CLINICAL DATA:  Hemorrhagic infarct EXAM: CT HEAD WITHOUT CONTRAST TECHNIQUE: Contiguous axial images were obtained from the base of the skull through the vertex without intravenous contrast. RADIATION DOSE REDUCTION: This exam was performed according to the departmental dose-optimization program which includes automated exposure control, adjustment of the mA and/or kV according to patient size and/or use of iterative reconstruction technique. COMPARISON:  None Available. FINDINGS: Brain: Intraparenchymal hematoma within the right basal ganglia is stable measuring 2.5 x 3.0 x 4.1 cm when measured in similar fashion. Mild associated mass effect with effacement of the right lateral ventricle and 3 mm right to left midline shift is unchanged. No evidence of ventricular entrapment. No interval hemorrhage. No extra-axial fluid collection. No intraventricular extension of  hemorrhage. Ventricular size is normal. Cerebellum is unremarkable. Vascular: No hyperdense vessel or unexpected calcification. Skull: Normal. Negative for fracture or focal lesion. Sinuses/Orbits: No acute finding. Other: Mastoid air cells and middle ear cavities are clear. IMPRESSION: 1. Stable examination. Stable intraparenchymal hematoma within the right basal ganglia with associated mass effect and 3 mm right to left midline shift. No evidence of intraventricular extension or interval hemorrhage. Electronically Signed   By: Helyn Numbers M.D.   On: 12/03/2022 21:08   CT ANGIO HEAD NECK W WO CM (CODE STROKE)  Result Date: 12/03/2022 CLINICAL DATA:  Neuro deficit, acute, stroke suspected EXAM: CT ANGIOGRAPHY HEAD AND NECK WITH AND WITHOUT CONTRAST TECHNIQUE: Multidetector CT imaging of the head and neck was performed using the standard protocol during bolus administration of intravenous contrast. Multiplanar CT image reconstructions and MIPs were obtained to evaluate the vascular anatomy. Carotid stenosis measurements (when applicable) are obtained utilizing NASCET criteria, using the distal internal carotid diameter as the denominator. RADIATION DOSE REDUCTION: This exam was performed according to the departmental dose-optimization program which includes automated exposure control, adjustment of the mA and/or kV according to patient size and/or use of iterative reconstruction technique. CONTRAST:  75mL OMNIPAQUE IOHEXOL 350 MG/ML SOLN COMPARISON:  Same day CT head. FINDINGS: CTA NECK FINDINGS Aortic arch: Great vessel origins are patent. Right carotid system: No evidence of dissection, stenosis (50% or greater), or occlusion. Left carotid system: No evidence of dissection, stenosis (50% or greater), or occlusion. Vertebral arteries: Right dominant. No evidence of dissection, stenosis (50% or greater), or occlusion. Skeleton: Negative. Other neck: Negative. Upper chest: Visualized lung apices are clear.  Review of the MIP images confirms the above findings CTA HEAD FINDINGS Anterior circulation: Bilateral intracranial ICAs, MCAs and ACAs are patent without proximal hemodynamically significant stenosis. No aneurysm or arteriovenous malformation identified in the region of acute hemorrhage; however, acute blood products does limit assessment. Posterior circulation: Bilateral intradural vertebral arteries, basilar artery and both posterior cerebral arteries are patent without proximal hemodynamically significant stenosis. Left fetal type PCA, anatomic variant. Venous sinuses: As permitted by contrast timing, patent. Review of the MIP images confirms the above findings IMPRESSION: 1. No aneurysm or arteriovenous malformation identified in the region of acute hemorrhage; however, acute blood products does limit assessment. 2. No emergent large vessel occlusion or proximal hemodynamically significant stenosis. Electronically Signed   By: Feliberto Harts M.D.   On: 12/03/2022 14:27   CT HEAD CODE STROKE WO CONTRAST  Result Date: 12/03/2022 CLINICAL DATA:  Code stroke. EXAM: CT  HEAD WITHOUT CONTRAST TECHNIQUE: Contiguous axial images were obtained from the base of the skull through the vertex without intravenous contrast. RADIATION DOSE REDUCTION: This exam was performed according to the departmental dose-optimization program which includes automated exposure control, adjustment of the mA and/or kV according to patient size and/or use of iterative reconstruction technique. COMPARISON:  None Available. FINDINGS: Brain: There is a 2.2 x 3.7 x 2.6 cm (~ 11 cc) parenchymal hematoma centered around the right basal ganglia and insula. There is no evidence of intraventricular extension. Minimal leftward midline shift measuring up to 2 mm. No hydrocephalus. No CT evidence of an acute cortical infarct. Vascular: No hyperdense vessel or unexpected calcification. Skull: Normal. Negative for fracture or focal lesion.  Sinuses/Orbits: No acute finding. Other: None. ASPECTS [facility] Stroke Program Early CT Score) IMPRESSION: Acute parenchymal hematoma centered around the right basal ganglia and insula measuring up to 2.2 x 3.7 x 2.6 cm. No evidence of intraventricular extension. Minimal leftward midline shift measuring up to 2 mm. Findings were paged to Dr. Pearlean Brownie on 12/03/22 at 2:17 PM via [facility] paging system. Electronically Signed   By: Lorenza Cambridge M.D.   On: 12/03/2022 14:18       HISTORY OF PRESENT ILLNESS 62 y.o. patient with history of hypertension and hypothyroidism was admitted with sudden onset slurred speech, confusion, left-sided facial droop, left-sided weakness and headache.  HOSPITAL COURSE Patient was found to have an 11 cc ICH in the right basal ganglia.  Early midline shift was seen on CT, and he was started on hypertonic saline.  However, his neurological exam had improved the next day and hypertonic saline was then discontinued.  His blood pressure was initially tightly controlled with Cleviprex, but this was able to be discontinued on 8/3.  Intraparenchymal Hemorrhage:  right BG IPH, etiology: Likely hypertensive Code Stroke CT head acute IPH to right basal ganglia measuring 11 cc CTA head & neck no emergent LVO, no aneurysm or AVM underlying IPH MRI unchanged right basal ganglia IPH with no aneurysm or mass underlying 2D Echo EF 50 to 55%, mildly dilated left atrium, no atrial level shunt LDL 157 HgbA1c 5.7 VTE prophylaxis -heparin subcu No antithrombotic prior to admission, now on No antithrombotic secondary to IPH Therapy recommendations: Outpatient PT/OT Disposition: Home   Cerebral edema (brain compression) Localized edema with 3 mm midline shift seen on initial CT Hypertonic saline started 8/2 at 75 cc/h Edema remained stable on imaging 8/3 and neurological exam is improved so we will discontinue hypertonic saline   Hypertension Home meds: Amlodipine 5 mg daily Stable Off  cleviprex On home amlodipine  Blood Pressure Goal: SBP less than 160  Long-term BP goal normotensive   Hyperlipidemia Home meds: None LDL 157, goal < 70 Add high intensity statin at discharge   Dysphagia Patient has post-stroke dysphagia SLP on board New on dysphagia 3  Advance diet as tolerated   Other Stroke Risk Factors None     Other Active Problems Hypothyroidism, resume Synthroid  RN Pressure Injury Documentation:     DISCHARGE EXAM  PHYSICAL EXAM General:  Alert, well-nourished, well-developed patient in no acute distress Psych:  Mood and affect appropriate for situation CV: Regular rate and rhythm on monitor Respiratory:  Regular, unlabored respirations on room air GI: Abdomen soft and nontender  NEURO:  Mental Status: AA&Ox3  Speech/Language: speech is without dysarthria or aphasia.  Naming, repetition, fluency, and comprehension intact.  Cranial Nerves:  II: PERRL. Visual fields full.  III, IV, VI:  EOMI. Eyelids elevate symmetrically.  V: Sensation is intact to light touch and symmetrical to face.  VII: Smile is symmetrical.  VIII: hearing intact to voice. IX, X: Palate elevates symmetrically. Phonation is normal.  JO:ACZYSAYT shrug 5/5. XII: tongue is midline without fasciculations. Motor: 5/5 strength to all muscle groups tested.  Tone: is normal and bulk is normal Sensation- Intact to light touch bilaterally. Extinction absent to light touch to DSS. Coordination: FTN intact bilaterally, HKS: no ataxia in BLE.No drift.  Gait- deferred   Discharge Diet       Diet   DIET DYS 3 Fluid consistency: Thin   liquids  DISCHARGE PLAN Disposition: Home No antithrombotic for secondary stroke prevention  Ongoing stroke risk factor control by Primary Care Physician at time of discharge Follow-up PCP Patient, No Pcp Per in 2 weeks. Follow-up in [facility] in 8 weeks, office to schedule an appointment.  25 minutes were  spent preparing discharge.  .sign

## 2022-12-05 NOTE — Progress Notes (Addendum)
STROKE TEAM PROGRESS NOTE   BRIEF HPI Richard Conrad is a 62 y.o. male with history of hypertension and hypothyroidism presenting with sudden onset slurred speech, confusion, left-sided facial droop and left-sided weakness with headache.  Patient was brought to the hospital and was found to have an 11 cc ICH in right basal ganglia.  He did have early midline shift, so hypertonic saline was started.  However, his neurological exam is improved today and midline shift is stable on CT, so protonic saline was discontinued   SIGNIFICANT HOSPITAL EVENTS 8/2 admitted to ICU, hypertonic saline started 8/3 hypertonic saline discontinued 8/4 transferred out of ICU  INTERIM HISTORY/SUBJECTIVE Patient is seen in his room with his wife at the bedside.  His neurological exam continues to improve today, and he is hemodynamically stable without need for Cleviprex to maintain blood pressure within goal.  Will transfer patient out of the ICU today.  Patient's wife states that she has noticed some mild leg tremors and episodes in which the patient seems to pause and look for words when he is trying to speak.  Will order EEG to rule out seizure activity.  OBJECTIVE  CBC    Component Value Date/Time   WBC 9.1 12/05/2022 0035   RBC 3.59 (L) 12/05/2022 0035   HGB 11.3 (L) 12/05/2022 0035   HCT 34.3 (L) 12/05/2022 0035   PLT 297 12/05/2022 0035   MCV 95.5 12/05/2022 0035   MCH 31.5 12/05/2022 0035   MCHC 32.9 12/05/2022 0035   RDW 13.6 12/05/2022 0035   LYMPHSABS 1.3 12/03/2022 1404   MONOABS 0.6 12/03/2022 1404   EOSABS 0.0 12/03/2022 1404   BASOSABS 0.0 12/03/2022 1404    BMET    Component Value Date/Time   NA 137 12/05/2022 0035   K 3.3 (L) 12/05/2022 0035   CL 105 12/05/2022 0035   CO2 25 12/05/2022 0035   GLUCOSE 84 12/05/2022 0035   BUN 18 12/05/2022 0035   CREATININE 1.02 12/05/2022 0035   CREATININE 1.03 04/11/2020 1418   CALCIUM 8.7 (L) 12/05/2022 0035   GFRNONAA >60 12/05/2022 0035    GFRNONAA 80 04/11/2020 1418    IMAGING past 24 hours ECHOCARDIOGRAM COMPLETE  Result Date: 12/04/2022    ECHOCARDIOGRAM REPORT   Patient Name:   Richard Conrad Date of Exam: 12/04/2022 Medical Rec #:  [id number]    Height:       69.0 in Accession #:    [id number]   Weight:       138.0 lb Date of Birth:  March 12, 1961    BSA:          1.765 m Patient Age:    62 years     BP:           141/91 mmHg Patient Gender: M            HR:           73 bpm. Exam Location:  Inpatient Procedure: 2D Echo, Cardiac Doppler and Color Doppler Indications:     Stroke I63.9  History:         Patient has no prior history of Echocardiogram examinations.                  Stroke; Risk Factors:Non-Smoker and Dyslipidemia.  Sonographer:     Dondra Prader RVT RCS Referring Phys:  [id number] Lennox Solders DE LA TORRE Diagnosing Phys: Weston Brass MD IMPRESSIONS  1. Left ventricular ejection fraction, by estimation, is 50 to 55%.  The left ventricle has low normal function. The left ventricle has no regional wall motion abnormalities. Left ventricular diastolic parameters were normal.  2. Right ventricular systolic function is normal. The right ventricular size is mildly enlarged. There is normal pulmonary artery systolic pressure. The estimated right ventricular systolic pressure is 29.9 mmHg.  3. Left atrial size was mildly dilated.  4. The mitral valve is normal in structure. Trivial mitral valve regurgitation. No evidence of mitral stenosis.  5. The aortic valve is tricuspid. Aortic valve regurgitation is mild. No aortic stenosis is present.  6. Aortic dilatation noted. There is borderline dilatation of the ascending aorta, measuring 40 mm.  7. The inferior vena cava is dilated in size with >50% respiratory variability, suggesting right atrial pressure of 8 mmHg. Conclusion(s)/Recommendation(s): No intracardiac source of embolism detected on this transthoracic study. Consider a transesophageal echocardiogram to exclude cardiac source of embolism if  clinically indicated. FINDINGS  Left Ventricle: Left ventricular ejection fraction, by estimation, is 50 to 55%. The left ventricle has low normal function. The left ventricle has no regional wall motion abnormalities. The left ventricular internal cavity size was normal in size. There is borderline left ventricular hypertrophy. Left ventricular diastolic parameters were normal. Right Ventricle: The right ventricular size is mildly enlarged. No increase in right ventricular wall thickness. Right ventricular systolic function is normal. There is normal pulmonary artery systolic pressure. The tricuspid regurgitant velocity is 2.34  m/s, and with an assumed right atrial pressure of 8 mmHg, the estimated right ventricular systolic pressure is 29.9 mmHg. Left Atrium: Left atrial size was mildly dilated. Right Atrium: Right atrial size was normal in size. Pericardium: There is no evidence of pericardial effusion. Mitral Valve: The mitral valve is normal in structure. Trivial mitral valve regurgitation. No evidence of mitral valve stenosis. Tricuspid Valve: The tricuspid valve is normal in structure. Tricuspid valve regurgitation is trivial. No evidence of tricuspid stenosis. Aortic Valve: The aortic valve is tricuspid. Aortic valve regurgitation is mild. No aortic stenosis is present. Aortic valve mean gradient measures 4.0 mmHg. Aortic valve peak gradient measures 6.6 mmHg. Aortic valve area, by VTI measures 2.91 cm. Pulmonic Valve: The pulmonic valve was normal in structure. Pulmonic valve regurgitation is trivial. No evidence of pulmonic stenosis. Aorta: Aortic dilatation noted. There is borderline dilatation of the ascending aorta, measuring 40 mm. Venous: The inferior vena cava is dilated in size with greater than 50% respiratory variability, suggesting right atrial pressure of 8 mmHg. IAS/Shunts: No atrial level shunt detected by color flow Doppler.  LEFT VENTRICLE PLAX 2D LVIDd:         3.70 cm   Diastology LVIDs:          2.20 cm   LV e' medial:    10.30 cm/s LV PW:         1.10 cm   LV E/e' medial:  6.1 LV IVS:        0.90 cm   LV e' lateral:   15.10 cm/s LVOT diam:     2.00 cm   LV E/e' lateral: 4.1 LV SV:         79 LV SV Index:   45 LVOT Area:     3.14 cm  RIGHT VENTRICLE             IVC RV S prime:     10.40 cm/s  IVC diam: 2.10 cm TAPSE (M-mode): 2.5 cm LEFT ATRIUM  Index        RIGHT ATRIUM           Index LA diam:        3.30 cm 1.87 cm/m   RA Area:     14.00 cm LA Vol (A2C):   61.9 ml 35.08 ml/m  RA Volume:   33.60 ml  19.04 ml/m LA Vol (A4C):   60.2 ml 34.11 ml/m LA Biplane Vol: 62.0 ml 35.13 ml/m  AORTIC VALVE                    PULMONIC VALVE AV Area (Vmax):    2.90 cm     PV Vmax:       0.93 m/s AV Area (Vmean):   2.68 cm     PV Peak grad:  3.4 mmHg AV Area (VTI):     2.91 cm AV Vmax:           128.00 cm/s AV Vmean:          89.800 cm/s AV VTI:            0.270 m AV Peak Grad:      6.6 mmHg AV Mean Grad:      4.0 mmHg LVOT Vmax:         118.00 cm/s LVOT Vmean:        76.700 cm/s LVOT VTI:          0.250 m LVOT/AV VTI ratio: 0.93 AR Vena Contracta: 0.50 cm  AORTA Ao Root diam: 3.20 cm Ao Asc diam:  4.00 cm MITRAL VALVE               TRICUSPID VALVE MV Area (PHT): 3.85 cm    TR Peak grad:   21.9 mmHg MV Decel Time: 197 msec    TR Vmax:        234.00 cm/s MV E velocity: 62.50 cm/s MV A velocity: 58.80 cm/s  SHUNTS MV E/A ratio:  1.06        Systemic VTI:  0.25 m                            Systemic Diam: 2.00 cm Weston Brass MD Electronically signed by Weston Brass MD Signature Date/Time: 12/04/2022/4:02:01 PM    Final (Updated)     Vitals:   12/05/22 0800 12/05/22 0900 12/05/22 1000 12/05/22 1100  BP: (!) 142/92 121/75 117/78 132/82  Pulse: 70 73 67 62  Resp: 16 16 15 18   Temp: 98 F (36.7 C)   98.2 F (36.8 C)  TempSrc: Oral   Oral  SpO2: 99% 99% 99% 97%  Weight:      Height:         PHYSICAL EXAM General:  Alert, well-nourished, well-developed patient in no acute  distress Psych:  Mood and affect appropriate for situation CV: Regular rate and rhythm on monitor Respiratory:  Regular, unlabored respirations on room air   NEURO:  Mental Status: AA&Ox3, patient is able to give clear and coherent history Speech/Language: speech is without dysarthria or aphasia.  Naming, repetition, fluency, and comprehension intact.  Cranial Nerves:  II: PERRL.  III, IV, VI: EOMI. Eyelids elevate symmetrically.  V: Sensation is intact to light touch and symmetrical to face.  VII: Left facial droop VIII: hearing intact to voice. IX, X: Phonation is normal.  ZO:[id number] shrug 5/5. XII: tongue is midline without fasciculations. Motor: 5/5 strength to all muscle groups tested  with decreased grip strength on the left Tone: is normal and bulk is normal Sensation- Intact to light touch bilaterally. Extinction absent to light touch to DSS.   Coordination: FTN intact bilaterally.subtle pronator drift in left arm Gait- deferred   ASSESSMENT/Conrad  Intraparenchymal Hemorrhage:  right BG IPH, etiology: Likely hypertensive Code Stroke CT head acute IPH to right basal ganglia measuring 11 cc CTA head & neck no emergent LVO, no aneurysm or AVM underlying IPH MRI unchanged right basal ganglia IPH with no aneurysm or mass underlying 2D Echo EF 50 to 55%, mildly dilated left atrium, no atrial level shunt EEG no seizure LDL 157 HgbA1c 5.7 VTE prophylaxis -heparin subcu No antithrombotic prior to admission, now on No antithrombotic secondary to IPH Therapy recommendations: Outpatient PT/OT Disposition: Pending  Cerebral edema (brain compression) Localized edema with 3 mm midline shift seen on initial CT Hypertonic saline started 8/2 at 75 cc/h Edema remained stable on imaging 8/3 and neurological exam is improved so we will discontinue hypertonic saline  Hypertension Home meds: Amlodipine 5 mg daily Stable Off cleviprex On home amlodipine  Blood Pressure Goal: SBP  less than 160  Long-term BP goal normotensive  Hyperlipidemia Home meds: None LDL 157, goal < 70 Add crestor 20 Continue statin at discharge  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 Pt was seen and examined.   Wife at the bedside. Pt sitting in chair, having breakfast.  Left upper extremity pronator drift resolved although subtle weakness on handgrip.  Still has mild left facial droop, otherwise neurologically intact.  Wife stated that patient yesterday had episodes of slow or not answer questions, lack of eye contact, etc, lasting short period, no LOC, still answered questions correctly eventually. No shaking jerking. Will order EEG. PT OT recommend outpt rehab. Add statin and transfer out of ICU  For detailed assessment and Conrad, please refer to above/below as I have made changes wherever appropriate.   Richard Plan, MD PhD Stroke Neurology 12/05/2022 6:39 PM  This patient is critically ill due to right BG ICH, hypertensive emergency, cerebral edema and at significant risk of neurological worsening, death form hematoma expansion, brain herniation, hypertensive encephalopathy. This patient's care requires constant monitoring of vital signs, hemodynamics, respiratory and cardiac monitoring, review of multiple databases, neurological assessment, discussion with family, other specialists and medical decision making of high complexity. I spent 35 minutes of neurocritical care time in the care of this patient. I had long discussion with wife and patient at bedside, updated pt current condition, treatment Conrad and  potential prognosis, and answered all the questions.  They expressed understanding and appreciation.   To contact Stroke Continuity provider, please refer to [email or web address]. After hours, contact General Neurology

## 2022-12-05 NOTE — Procedures (Signed)
Patient Name: Richard Conrad  MRN: [id number]  Epilepsy Attending: Windell Norfolk  Referring Physician/Provider: Lina Sar Date: 12/05/2022 Duration: 26 minutes   Patient history: 73 with right basal ganglia stroke, EEG to evaluate for seizure  Level of alertness: Awake, alert  AEDs during EEG study: None   Technical aspects: This EEG study was done with scalp electrodes positioned according to the 10-20 International system of electrode placement. Electrical activity was reviewed with band pass filter of 1-70Hz , sensitivity of 7 uV/mm, display speed of 95mm/sec with a 60Hz  notched filter applied as appropriate. EEG data were recorded continuously and digitally stored.  Video monitoring was available and reviewed as appropriate.  Description: The posterior dominant rhythm consists of 9-10 Hz activity of moderate voltage (25-35 uV) seen predominantly in posterior head regions, symmetric and reactive to eye opening and eye closing. Drowsiness was characterized by attenuation of the posterior background rhythm. Sleep was not seen. There was right anterior region focal slowing. No epileptiform discharges seen. Physiologic photic driving was not seen during photic stimulation.  Hyperventilation was not performed.  ABNORMALITY -Right anterior focal slowing   IMPRESSION: This study is suggestive of cortical dysfunction arising from right frontal region, nonspecific etiology, likely secondary to underlying structural abnormality. There were no seizures or epileptiform discharges seen during this recording.    Taige Housman

## 2022-12-05 NOTE — Progress Notes (Signed)
EEG complete - results pending.   GMD/ TM 

## 2022-12-06 ENCOUNTER — Other Ambulatory Visit: Payer: Self-pay | Admitting: Neurology

## 2022-12-06 DIAGNOSIS — I610 Nontraumatic intracerebral hemorrhage in hemisphere, subcortical: Secondary | ICD-10-CM

## 2022-12-06 MED ORDER — ADULT MULTIVITAMIN W/MINERALS CH: 1.0000 | ORAL_TABLET | Freq: Every day | ORAL | 0 refills | Status: AC

## 2022-12-06 MED ORDER — LEVOTHYROXINE SODIUM 75 MCG PO TABS: ORAL_TABLET | ORAL | 3 refills | Status: AC

## 2022-12-06 MED ORDER — LEVOTHYROXINE SODIUM 75 MCG PO TABS: ORAL_TABLET | ORAL | 0 refills | Status: DC

## 2022-12-06 MED ORDER — AMLODIPINE BESYLATE 5 MG PO TABS: 5.0000 mg | ORAL_TABLET | Freq: Every morning | ORAL | 3 refills | Status: AC

## 2022-12-06 MED ORDER — ROSUVASTATIN CALCIUM 20 MG PO TABS: 20.0000 mg | ORAL_TABLET | Freq: Every day | ORAL | 0 refills | Status: AC

## 2022-12-06 NOTE — Progress Notes (Signed)
Speech Language Pathology Treatment: Dysphagia  Patient Details Name: Richard Conrad MRN: [id number] DOB: 21-Jul-1960 Today's Date: 12/06/2022 Time: 1245-1310 SLP Time Calculation (min) (ACUTE ONLY): 25 min  Assessment / Plan / Recommendation Clinical Impression  Pt seen for dysphagia tx with upgraded consistencies of regular/thin via straw without overt s/sx of aspiration present; no pocketing on L buccal cavity and pt with mod I cueing informed SLP of precautions to use during meals including slow rate, small bites/sips and decreased distractions such as not speaking during meal consumption.  Pt also able to verbalize precautions for mobility needs (WU:[id number] someone to A him with walking/driving him places, etc).  Discussed compensatory strategies to continue to use during meals/snacks and with functional activities d/t decreased safety awareness/L inattention needs.  Progress diet to regular/thin liquids with precautions stated above and intermittent supervision.  Recommend ST f/u in OP setting for full cognitive assessment.  ST will s/o in acute setting.  HPI HPI: Richard Conrad is a 62 y.o. male with history of hypothyroidism and hypertension who presents with sudden onset slurred speech, confusion, left-sided facial droop and left-sided weakness. Patient states that he had gone to the gym earlier today to lift weights and had had a headache throughout the morning. At about 10:00, he was going to meet a friend for lunch when he had sudden onset confusion and left-sided weakness. His friend called EMS and he was brought to the hospital. 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 Due to early signs of midline shift being present, hypertonic saline was started. Patient states that at home, he is blood pressure is normally under control. On Saturday, he had an episode of vertigo and nausea, but this resolved spontaneously.ST consulted for BSE to assess swallow function and SLE for speech/language  assessment; ST f/u for cognitive reorganization/dysphagia management.      SLP Plan  Discharge SLP treatment due to (comment) (pt imminently being d/c to OP SLP)      Recommendations for follow up therapy are one component of a multi-disciplinary discharge planning process, led by the attending physician.  Recommendations may be updated based on patient status, additional functional criteria and insurance authorization.    Recommendations  Diet recommendations: Regular;Thin liquid Liquids provided via: Cup;Straw Medication Administration: Whole meds with liquid (single pills) Supervision: Intermittent supervision to cue for compensatory strategies Compensations: Slow rate;Small sips/bites                  Oral care BID;Patient independent with oral care   Intermittent Supervision/Assistance Cognitive communication deficit (R41.841);Attention and concentration deficit Other Nontraumatic ICH   Discharge SLP treatment due to (comment) (pt imminently being d/c to OP SLP)     Pat Richard Conrad,M.S., CCC-SLP  12/06/2022, 1:15 PM

## 2022-12-06 NOTE — Plan of Care (Signed)
  Problem: Education: Goal: Knowledge of disease or condition will improve Outcome: Progressing Goal: Knowledge of secondary prevention will improve (MUST DOCUMENT ALL) Outcome: Progressing Goal: Knowledge of patient specific risk factors will improve (Mark N/A or DELETE if not current risk factor) Outcome: Progressing   Problem: Intracerebral Hemorrhage Tissue Perfusion: Goal: Complications of Intracerebral Hemorrhage will be minimized Outcome: Progressing   Problem: Coping: Goal: Will verbalize positive feelings about self Outcome: Progressing Goal: Will identify appropriate support needs Outcome: Progressing   Problem: Health Behavior/Discharge Planning: Goal: Ability to manage health-related needs will improve Outcome: Progressing Goal: Goals will be collaboratively established with patient/family Outcome: Progressing   Problem: Self-Care: Goal: Ability to participate in self-care as condition permits will improve Outcome: Progressing Goal: Verbalization of feelings and concerns over difficulty with self-care will improve Outcome: Progressing Goal: Ability to communicate needs accurately will improve Outcome: Progressing   Problem: Nutrition: Goal: Risk of aspiration will decrease Outcome: Progressing Goal: Dietary intake will improve Outcome: Progressing   

## 2022-12-06 NOTE — TOC Transition Note (Addendum)
Transition of Care [facility]) - CM/SW Discharge Note   Patient Details  Name: Richard Conrad MRN: [id number] Date of Birth: 18-Jul-1960  [facility] CM/SW Contact:  Gordy Clement, RN Phone Number: 12/06/2022, 11:32 AM   Clinical Narrative:     Patient will DC to home with Wife today. Outpatient neuro PT has been referred and Patient will need to call and schedule appointment . Patient will also need to schedule a follow up with his PCP , Dwyane Luo with [facility] . Called patient to verify PCP. AVS updated  No additional TOC needs            Patient Goals and CMS Choice      Discharge Placement                         Discharge Plan and Services Additional resources added to the After Visit Summary for                                       Social Determinants of Health (SDOH) Interventions SDOH Screenings   Food Insecurity: No Food Insecurity (12/03/2022)  Housing: Low Risk  (12/03/2022)  Transportation Needs: No Transportation Needs (12/03/2022)  Utilities: Not At Risk (12/03/2022)  Depression (PHQ2-9): Low Risk  (04/07/2021)  Tobacco Use: Low Risk  (03/11/2022)     Readmission Risk Interventions     No data to display

## 2022-12-06 NOTE — Progress Notes (Signed)
Discharge paperwork gone over with Almetta Lovely by this RN. Patient verbalized understanding and has no questions on discharge instructions. Patient taken to family vehicle via wheelchair.   12/06/2022 Oralia Manis, RN 12:56 PM

## 2022-12-06 NOTE — Progress Notes (Signed)
Physical Therapy Treatment and Discharge Patient Details Name: Richard Conrad MRN: [id number] DOB: 06-25-1960 Today's Date: 12/06/2022   History of Present Illness The pt is a 62 yo male presenting 8/2 with acute onset slurred speech, confusion, and L-sided weakness. 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 PMH includes: HTN and hypothyroidism.    PT Comments  Pt met his physical therapy goals during his inpatient stay. Pt ambulating 250 ft with no assistive device and negotiated a half flight of stairs independently. Scoring 19/24 on the Dynamic Gait Index, indicating dynamic balance deficits. Recommend OPPT (neuro) at d/c. Discussed with pt and pt spouse. No further acute PT needs. Thank you for this consult.   If plan is discharge home, recommend the following: Assist for transportation   Can travel by private vehicle        Equipment Recommendations  None recommended by PT    Recommendations for Other Services       Precautions / Restrictions Precautions Precautions: None Restrictions Weight Bearing Restrictions: No     Mobility  Bed Mobility Overal bed mobility: Independent                  Transfers Overall transfer level: Independent Equipment used: None                    Ambulation/Gait Ambulation/Gait assistance: Independent Educational psychologist (Feet): 250 Feet Assistive device: None             Stairs Stairs: Yes Stairs assistance: Modified independent (Device/Increase time) Stair Management: One rail Left Number of Stairs: 12 General stair comments: step over step pattern   Wheelchair Mobility     Tilt Bed    Modified Rankin (Stroke Patients Only) Modified Rankin (Stroke Patients Only) Pre-Morbid Rankin Score: No symptoms Modified Rankin: No significant disability     Balance Overall balance assessment: Needs assistance Sitting-balance support: Feet supported Sitting balance-Leahy Scale: Normal     Standing balance  support: No upper extremity supported, During functional activity Standing balance-Leahy Scale: Good       Tandem Stance - Right Leg: 10 (increased sway) Tandem Stance - Left Leg: 10 Rhomberg - Eyes Opened: 10 Rhomberg - Eyes Closed: 10     Standardized Balance Assessment Standardized Balance Assessment : Dynamic Gait Index   Dynamic Gait Index Level Surface: Normal Change in Gait Speed: Mild Impairment Gait with Horizontal Head Turns: Normal Gait with Vertical Head Turns: Mild Impairment Gait and Pivot Turn: Mild Impairment Step Over Obstacle: Normal Step Around Obstacles: Mild Impairment Steps: Mild Impairment Total Score: 19      Cognition Arousal/Alertness: Awake/alert Behavior During Therapy: Flat affect Overall Cognitive Status: Within Functional Limits for tasks assessed                                 General Comments: WFL for basic mobility; higher level cognition not assessed        Exercises      General Comments        Pertinent Vitals/Pain Pain Assessment Pain Assessment: No/denies pain    Home Living                          Prior Function            PT Goals (current goals can now be found in the care plan section) Acute Rehab PT Goals Patient  Stated Goal: return home Potential to Achieve Goals: Good Progress towards PT goals: Goals met/education completed, patient discharged from PT    Frequency    Min 1X/week      PT Plan Other (comment) (d/c acute PT)    Co-evaluation              AM-PAC PT "6 Clicks" Mobility   Outcome Measure  Help needed turning from your back to your side while in a flat bed without using bedrails?: None Help needed moving from lying on your back to sitting on the side of a flat bed without using bedrails?: None Help needed moving to and from a bed to a chair (including a wheelchair)?: None Help needed standing up from a chair using your arms (e.g., wheelchair or bedside  chair)?: None Help needed to walk in hospital room?: None Help needed climbing 3-5 steps with a railing? : None 6 Click Score: 24    End of Session   Activity Tolerance: Patient tolerated treatment well Patient left: Other (comment) (in bathroom with wife (pt independent)) Nurse Communication: Mobility status PT Visit Diagnosis: Unsteadiness on feet (R26.81);Other abnormalities of gait and mobility (R26.89);Hemiplegia and hemiparesis Hemiplegia - Right/Left: Left Hemiplegia - dominant/non-dominant: Non-dominant Hemiplegia - caused by: Nontraumatic intracerebral hemorrhage     Time: 0916-0931 PT Time Calculation (min) (ACUTE ONLY): 15 min  Charges:    $Therapeutic Activity: 8-22 mins PT General Charges $$ ACUTE PT VISIT: 1 Visit                     Lillia Pauls, PT, DPT Acute Rehabilitation Services Office [phone]    Norval Morton 12/06/2022, 10:13 AM

## 2022-12-08 ENCOUNTER — Ambulatory Visit: Payer: Self-pay | Attending: Orthopaedic Surgery | Admitting: Physical Therapy

## 2022-12-08 ENCOUNTER — Encounter: Payer: Self-pay | Admitting: Physical Therapy

## 2022-12-08 ENCOUNTER — Other Ambulatory Visit: Payer: Self-pay

## 2022-12-08 VITALS — BP 128/80 | HR 64

## 2022-12-08 DIAGNOSIS — I69254 Hemiplegia and hemiparesis following other nontraumatic intracranial hemorrhage affecting left non-dominant side: Secondary | ICD-10-CM | POA: Insufficient documentation

## 2022-12-08 DIAGNOSIS — M6281 Muscle weakness (generalized): Secondary | ICD-10-CM | POA: Insufficient documentation

## 2022-12-08 NOTE — Therapy (Signed)
OUTPATIENT PHYSICAL THERAPY NEURO EVALUATION   Patient Name: Richard Conrad MRN: [id number] DOB:1960/05/11, 62 y.o., male Today's Date: 12/08/2022   PCP: Samuella Bruin REFERRING PROVIDER: Marvel Plan, MD  END OF SESSION:  PT End of Session - 12/08/22 1027     Visit Number 1    Authorization Type Self pay    PT Start Time 1020    PT Stop Time 1120    PT Time Calculation (min) 60 min    Equipment Utilized During Treatment Gait belt    Behavior During Therapy WFL for tasks assessed/performed             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" (03/23/2016)   History of kidney stones    "passed them"   Hypertension    Hypothyroidism    Iron deficiency anemia    Seasonal allergies    "fall and spring; mild" (03/23/2016)   Past Surgical History:  Procedure Laterality Date   APPENDECTOMY  03/23/2016   COLON RESECTION N/A 03/23/2016   Procedure: LAPAROSCOPIC CECECTOMY;  Surgeon: Abigail Miyamoto, MD;  Location: [facility] OR;  Service: General;  Laterality: N/A;   COLON SURGERY  03/23/2016   LAPAROSCOPIC CECECTOMY     COLONOSCOPY     ESOPHAGOGASTRODUODENOSCOPY ENDOSCOPY     HARDWARE REMOVAL Right 01/21/2022   Procedure: HARDWARE REMOVAL WRIST;  Surgeon: Marlyne Beards, MD;  Location: [facility] OR;  Service: Orthopedics;  Laterality: Right;  61   OPEN REDUCTION INTERNAL FIXATION (ORIF) DISTAL RADIAL FRACTURE Right 10/21/2021   Procedure: RIGHT OPEN REDUCTION INTERNAL FIXATION (ORIF) DISTAL RADIAL FRACTURE;  Surgeon: Marlyne Beards, MD;  Location: [facility] OR;  Service: Orthopedics;  Laterality: Right;   RADIAL ARTERY ANEURYSM REPAIR  2019   TONSILLECTOMY     VASECTOMY     Patient Active Problem List   Diagnosis Date Noted   Stroke, hemorrhagic (HCC) 12/03/2022   Closed fracture of right distal radius 10/13/2021   Dyslipidemia 04/08/2021   Hyperglycemia 04/08/2021    Benign essential HTN 04/11/2020   Hypothyroidism (acquired) 04/11/2020   Gastroesophageal reflux disease without esophagitis 12/06/2017   Seasonal allergic rhinitis 12/06/2017   Colon polyp, hyperplastic 03/23/2016    ONSET DATE: CVA on 12/03/2022  REFERRING DIAG: I63.9 (ICD-10-CM) - Stroke (HCC)  THERAPY DIAG:  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  He used to bowl and walk his dogs frequently prior to his stroke and he saw a personal trainer 3 days a week for weight-lifting.   Pt accompanied by: significant other-wife Richard Conrad  PERTINENT HISTORY: hypertension and hypothyroidism   Admitted with sudden onset slurred speech, confusion, left-sided facial droop, left-sided weakness and headache.  11 cc ICH in the right basal ganglia. Early midline shift was seen on CT, and he was started on hypertonic saline.  Discharged from acute to home.  PAIN:  Are you having pain? No  PRECAUTIONS: Fall  RED FLAGS: None   WEIGHT BEARING RESTRICTIONS: No  FALLS: Has patient fallen in last 6 months? No  LIVING ENVIRONMENT: Lives with: lives with their spouse and 2 small and medium dogs Lives in: House/apartment Stairs: Yes: Internal: 15 steps; on right going up and External: 3 steps; on left going up Has following equipment at home: Single point cane  PLOF: Independent  PATIENT GOALS: Patient not sure.  OBJECTIVE:   DIAGNOSTIC FINDINGS:  MRI Brain 12/04/2022 IMPRESSION: 1. No infarct or mass seen underlying the right cerebral  ICH. 2. 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  COGNITION: Overall cognitive status:  Pt requires increased processing time, wife reinforces this.  Pt and wife would like to hold off on ST referral as they were told to expect some resolution of symptoms as brain bleed resolves over next 3-4 weeks which is same timeframe as his referral to neurology.  They state they will ask neurologist if he is still struggling with this.   SENSATION: Light touch: WFL  COORDINATION: LE RAMS:  WNL Bilateral Heel-to-shin:  WNL  EDEMA:  None noted in BLE.  MUSCLE TONE: BLE WNL, no clonus  POSTURE: No Significant postural limitations  LOWER EXTREMITY ROM:     Active  Right Eval Left Eval  Hip flexion WNL WNL  Hip extension    Hip abduction " "  Hip adduction " "  Hip internal rotation    Hip external rotation    Knee flexion " "  Knee extension " "  Ankle dorsiflexion " "  Ankle plantarflexion    Ankle inversion    Ankle eversion     (Blank rows = not tested)  LOWER EXTREMITY MMT:    MMT Right Eval Left Eval  Hip flexion 4+/5 4+/5  Hip extension    Hip abduction 4/5 4/5  Hip adduction 4+/5 4/5  Hip internal rotation    Hip external rotation    Knee flexion 4+/5 4+/5  Knee extension 5/5 5/5  Ankle dorsiflexion 4+/5 4+/5  Ankle plantarflexion    Ankle inversion    Ankle eversion    (Blank rows = not tested)  BED MOBILITY:  Sit to supine Complete Independence Supine to sit Complete Independence Rolling to Right Complete Independence Rolling to Left Complete Independence  TRANSFERS: Assistive device utilized: None  Sit to stand: Complete Independence Stand to sit: Complete Independence Chair to chair: Complete Independence  GAIT: Gait pattern: WFL Distance walked: various clinic distances Assistive device utilized: None Level of assistance: Complete Independence Comments: Patient appears  steady with good pace.  No notable drifting or LOB.  No toe catch or poor foot clearance on either side.  Pt prefers to ambulate with hands in pockets, unsure if this is to steady himself or just habit.  FUNCTIONAL TESTS:  5  times sit to stand: 13.47 seconds no UE support 10 meter walk test: 6.97 seconds no AD IND = 1.43 m/sec OR 4.73 ft/sec Functional gait assessment: 27/30 = low fall risk  [facility] PT Assessment - 12/08/22 1058       Functional Gait  Assessment   Gait assessed  Yes    Gait Level Surface Walks 20 ft in less than 5.5 sec, no assistive devices, good speed, no evidence for imbalance, normal gait pattern, deviates no more than 6 in outside of the 12 in walkway width.    Change in Gait Speed Able to smoothly change walking speed without loss of balance or gait deviation. Deviate no more than 6 in outside of the 12 in walkway width.    Gait with Horizontal Head Turns Performs head turns smoothly with no change in gait. Deviates no more than 6 in outside 12 in walkway width    Gait with Vertical Head Turns Performs head turns with no change in gait. Deviates no more than 6 in outside 12 in walkway width.    Gait and Pivot Turn Pivot turns safely in greater than 3 sec and stops with no loss of balance, or pivot turns safely within 3 sec and stops with mild imbalance, requires small steps to catch balance.    Step Over Obstacle Is able to step over 2 stacked shoe boxes taped together (9 in total height) without changing gait speed. No evidence of imbalance.    Gait with Narrow Base of Support Ambulates 7-9 steps.    Gait with Eyes Closed Walks 20 ft, uses assistive device, slower speed, mild gait deviations, deviates 6-10 in outside 12 in walkway width. Ambulates 20 ft in less than 9 sec but greater than 7 sec.    Ambulating Backwards Walks 20 ft, no assistive devices, good speed, no evidence for imbalance, normal gait    Steps Alternating feet, no rail.    Total Score 27    FGA comment:  27/30 = low fall risk            PATIENT SURVEYS:  FOTO Not captured at intake.  TODAY'S TREATMENT:                                                                                                                              DATE: Education and establishing HEP (only verbally reviewed).   PATIENT EDUCATION: Education details: Initial HEP (verbally reviewed and discussed transitioning these into gym environment using ballet bars, etc), gradual return to gym using body weight and aerobics and communication with personal trainer regarding fatigue and switch to endurance focused training.  Discussed ongoing BP self-monitoring and limits for exercise.  Education on concerning symptoms requiring reassessment/emergent attention.  Education regarding deficits PT was assessing for vs patient presenting characteristics today.  Encouraged patient to trial walking a single dog using strong UE first then try managing 2 dogs using separate hands to better guide each dog  and modify as needed using wife's supervision/assistance for safety.   Person educated: Patient and Spouse-Wife Gunnar Fusi) Education method: Explanation and Handouts Education comprehension: verbalized understanding  HOME EXERCISE PROGRAM: Access Code: [email or web address] URL: [email or web address] Date: 12/08/2022 Prepared by: Camille Bal  Exercises - Single Leg Bridge  - 1 x daily - 5 x weekly - 2 sets - 10 reps - Lunge with Counter Support  - 1 x daily - 5 x weekly - 1 sets - 10 reps - Side Lunge with Counter Support  - 1 x daily - 5 x weekly - 1 sets - 10 reps - Forward T with Counter Support  - 1 x daily - 5 x weekly - 1 sets - 10 reps - Squat with Counter Support  - 1 x daily - 5 x weekly - 1-2 sets - 10 reps - Walking with Eyes Closed and Counter Support  - 1 x daily - 5 x weekly - 3 sets - 10 reps  ASSESSMENT:  CLINICAL IMPRESSION: Patient is a 62 y.o. male who was seen today for physical therapy evaluation and  treatment for left hemiparesis following acute ICH in the right basal ganglia.  Pt has a significant PMH of hypertension and hypothyroidism.  Identified impairments include mild BLE weakness and ongoing fatigue.  Evaluation via the following assessment tools: 5xSTS and FGA indicate a low fall risk.  His walking speed on the was above average with no notable impairments of gait. He was provided an introductory HEP and extensive education to progress in home environment.  He does not require skilled PT services at this time, but may benefit from ST referral if cognitive processing delay does not further resolve by next MD follow-up.   CLINICAL DECISION MAKING: Stable/uncomplicated  EVALUATION COMPLEXITY: Low  PLAN:  PT FREQUENCY: one time visit  PT DURATION: other: one time visit  Sadie Haber, PT, DPT 12/08/2022, 12:35 PM

## 2022-12-08 NOTE — Patient Instructions (Signed)
Access Code: [email or web address] URL: [email or web address] Date: 12/08/2022 Prepared by: Camille Bal  Exercises - Single Leg Bridge  - 1 x daily - 5 x weekly - 2 sets - 10 reps - Lunge with Counter Support  - 1 x daily - 5 x weekly - 1 sets - 10 reps - Side Lunge with Counter Support  - 1 x daily - 5 x weekly - 1 sets - 10 reps - Forward T with Counter Support  - 1 x daily - 5 x weekly - 1 sets - 10 reps - Squat with Counter Support  - 1 x daily - 5 x weekly - 1-2 sets - 10 reps - Walking with Eyes Closed and Counter Support  - 1 x daily - 5 x weekly - 3 sets - 10 reps

## 2022-12-30 ENCOUNTER — Encounter (HOSPITAL_BASED_OUTPATIENT_CLINIC_OR_DEPARTMENT_OTHER): Payer: Self-pay | Admitting: Emergency Medicine

## 2022-12-30 ENCOUNTER — Emergency Department (HOSPITAL_BASED_OUTPATIENT_CLINIC_OR_DEPARTMENT_OTHER): Payer: Self-pay

## 2022-12-30 ENCOUNTER — Other Ambulatory Visit: Payer: Self-pay

## 2022-12-30 ENCOUNTER — Emergency Department (HOSPITAL_BASED_OUTPATIENT_CLINIC_OR_DEPARTMENT_OTHER)
Admission: EM | Admit: 2022-12-30 | Discharge: 2022-12-30 | Disposition: A | Discharge status: Home / Self Care | Payer: Self-pay | Attending: Emergency Medicine | Admitting: Emergency Medicine

## 2022-12-30 DIAGNOSIS — G936 Cerebral edema: Secondary | ICD-10-CM

## 2022-12-30 DIAGNOSIS — I619 Nontraumatic intracerebral hemorrhage, unspecified: Secondary | ICD-10-CM | POA: Insufficient documentation

## 2022-12-30 HISTORY — DX: Cerebral infarction, unspecified: I63.9

## 2022-12-30 NOTE — ED Notes (Signed)
Spoke with Dr.Paterson to discuss patient care.

## 2022-12-30 NOTE — Discharge Instructions (Addendum)
You were seen for your headache in the emergency department.   At home, please take Tylenol for your headache.    Check your MyChart online for the results of any tests that had not resulted by the time you left the emergency department.   Follow-up with your primary doctor in 2-3 days regarding your visit.  Follow-up with neurology and talk to them about obtaining an MRI.  Return immediately to the emergency department if you experience any of the following: Worsening pain, vision changes, weakness or numbness of your arms or legs, or any other concerning symptoms.    Thank you for visiting our Emergency Department. It was a pleasure taking care of you today.

## 2022-12-30 NOTE — ED Notes (Signed)
Patient transported to CT 

## 2022-12-30 NOTE — ED Triage Notes (Signed)
Pt had stroke 4 weeks ago,had a bleed. Pt has been having slight head pressure since, but today feels worse. No neuro changes. Pt's wife states he looks shaky.

## 2022-12-30 NOTE — ED Notes (Signed)
Pt returned from CT °

## 2022-12-30 NOTE — ED Provider Notes (Signed)
[facility] EMERGENC[facility] DEPARTMENT AT [facility] Provider Note   CSN: [id number] Arrival date & time: 12/30/22  4332     History  No chief complaint on file.   Richard Conrad is a 62 y.o. male.  62 year old male with a history of ICH who presents to the emergency department with headache.  On December 03, 2022 patient had a right basal cannula hemorrhagic stroke with 2 mm of leftward shift.  Was hospitalized and observed but did not have any surgical intervention.  Went home and has had persistent frontal headache.  Last night went from a 2/10 in severity to 3/10 in severity this morning also with a mild occipital headache that he says feels like pressure.  No vision changes or weakness or numbness of his arms or legs.  No trauma.  No anticoagulation use.  Feels like he may be having some tremors as well but says that these have been ongoing for months.       Home Medications Prior to Admission medications   Medication Sig Start Date End Date Taking? Authorizing Provider  amLODipine (NORVASC) 5 MG tablet Take 1 tablet (5 mg total) by mouth every morning. 12/06/22   Arline Asp, NP  Ascorbic Acid (VITAMIN C PO) Take 1 tablet by mouth daily as needed (immune support).    [provider]  Cholecalciferol (DIALYVITE VITAMIN D 5000) 125 MCG (5000 UT) capsule Take 5,000 Units by mouth daily. Patient not taking: Reported on 12/03/2022    [provider]  levothyroxine (SYNTHROID) 75 MCG tablet Take one tablet daily before breakfast 12/06/22   Marvel Plan, MD  levothyroxine (SYNTHROID) 75 MCG tablet Take 75 mcg by mouth daily before breakfast.    [provider]  magnesium oxide (MAG-OX) 400 (240 Mg) MG tablet Take 400 mg by mouth daily as needed (Immune Support).    [provider]  Multiple Vitamin (MULTIVITAMIN WITH MINERALS) TABS tablet Take 1 tablet by mouth daily. 12/06/22   Arline Asp, NP  rosuvastatin (CRESTOR) 20 MG tablet Take 1 tablet  (20 mg total) by mouth daily. 12/06/22   Arline Asp, NP  sildenafil (REVATIO) 20 MG tablet Take 20 mg by mouth as needed. 11/09/22   [provider]      Allergies    Ciprofloxacin and Doxycycline    Review of Systems   Review of Systems  Physical Exam Updated Vital Signs BP 137/86   Pulse 72   Temp 97.8 F (36.6 C)   Resp 20   SpO2 99%  Physical Exam Vitals and nursing note reviewed.  Constitutional:      General: He is not in acute distress.    Appearance: He is well-developed.  HENT:     Head: Normocephalic and atraumatic.     Right Ear: External ear normal.     Left Ear: External ear normal.     Nose: Nose normal.  Eyes:     Extraocular Movements: Extraocular movements intact.     Conjunctiva/sclera: Conjunctivae normal.     Pupils: Pupils are equal, round, and reactive to light.  Pulmonary:     Effort: Pulmonary effort is normal. No respiratory distress.  Musculoskeletal:     Cervical back: Normal range of motion and neck supple.     Right lower leg: No edema.     Left lower leg: No edema.  Skin:    General: Skin is warm and dry.  Neurological:     Mental Status: He is  alert. Mental status is at baseline.     Comments: MENTAL STATUS: AAOx3 CRANIAL NERVES: II: Pupils equal and reactive 4 mm BL, no RAPD, no VF deficits III, IV, VI: EOM intact, no gaze preference or deviation, no nystagmus. V: normal sensation to light touch in V1, V2, and V3 segments bilaterally VII: no facial weakness or asymmetry, no nasolabial fold flattening VIII: normal hearing to speech and finger friction IX, X: normal palatal elevation, no uvular deviation XI: 5/5 head turn and 5/5 shoulder shrug bilaterally XII: midline tongue protrusion MOTOR: 5/5 strength in R shoulder flexion, elbow flexion and extension, and grip strength. 5/5 strength in L shoulder flexion, elbow flexion and extension, and grip strength.  5/5 strength in R hip and knee flexion, knee extension, ankle  plantar and dorsiflexion. 5/5 strength in L hip and knee flexion, knee extension, ankle plantar and dorsiflexion. SENSORY: Normal sensation to light touch in all extremities COORD: Normal finger to nose and heel to shin, no dysmetria, mild resting tremor of the left upper extremity STATION: normal stance, no truncal ataxia GAIT: Normal No aphasia.  Psychiatric:        Mood and Affect: Mood normal.        Behavior: Behavior normal.     ED Results / Procedures / Treatments   Labs (all labs ordered are listed, but only abnormal results are displayed) Labs Reviewed - No data to display  EKG None  Radiology CT Head Wo Contrast  Result Date: 12/30/2022 CLINICAL DATA:  62 year old male status post code stroke presentation on 12/03/2022 with right basal ganglia hemorrhage. Worsening head pressure. EXAM: CT HEAD WITHOUT CONTRAST TECHNIQUE: Contiguous axial images were obtained from the base of the skull through the vertex without intravenous contrast. RADIATION DOSE REDUCTION: This exam was performed according to the departmental dose-optimization program which includes automated exposure control, adjustment of the mA and/or kV according to patient size and/or use of iterative reconstruction technique. COMPARISON:  Brain MRI 12/04/2022 and earlier. FINDINGS: Brain: Biconvex developing encephalomalacia corresponding to the epicenter of the right basal ganglia hemorrhage earlier this month (series 4, image 35). However, progressive confluent white matter hypodensity in the surrounding right hemisphere since the previous exams (series 2 images 14 and 17. Furthermore, mild mass effect on the right lateral ventricle has not resolved and there is on going trace leftward midline shift. But no cortical edema or abnormality is identified in the right hemisphere. Basilar cisterns remain patent. No ventriculomegaly. No acute intracranial hemorrhage. Left hemisphere and posterior fossa gray-white differentiation  appears stable. Vascular: No suspicious intracranial vascular hyperdensity. Skull: Intact.  No acute or suspicious osseous lesion identified. Sinuses/Orbits: Visualized paranasal sinuses and mastoids are stable and well aerated. Other: Visualized orbits and scalp soft tissues are within normal limits. IMPRESSION: 1. Expected resolution of the hyperdense right basal ganglia blood products from earlier this month. But progressive white matter hypodensity in the surrounding right hemisphere, most resembling vasogenic edema. And ongoing mass effect on the right lateral ventricle. Recommend repeat Brain MRI without and with contrast to further characterize. 2. Elsewhere stable non contrast CT appearance of the brain. Electronically Signed   By: Odessa Fleming M.D.   On: 12/30/2022 10:42    Procedures Procedures    Medications Ordered in ED Medications - No data to display  ED Course/ Medical Decision Making/ A&P Clinical Course as of 12/30/22 1910  Thu Dec 30, 2022  1245 Dr Conchita Paris from neurosurgery consulted and has reviewed the images [RP]  Clinical Course User Index [RP] Rondel Baton, MD                                 Medical Decision Making Amount and/or Complexity of Data Reviewed Radiology: ordered.   DEKE RENCH is a 62 y.o. male with comorbidities that complicate the patient evaluation including spontaneous hemorrhagic stroke who presents to the emergency department with a headache and tremors  Initial Ddx:  Recurrent ICH, posttraumatic headache, stroke  MDM/Course:  Patient presents to the emergency department with headache that is 3/10 in severity currently.  Says this is only mildly worse than before but is very anxious given the fact that he did have a hemorrhagic stroke on the second of this month.  Has a nonfocal neurologic exam.  Does have some occasional tremor of his left upper extremity but reports that this has been present for months though it does appear to be  somewhat worse now.  No shaking at rest or anything that would be concerning for seizure.  Had a repeat head CT that showed persistent vasogenic edema but no bleeding.  They did recommend an MRI for follow-up to further characterize.  Did discuss this with neurosurgery who is on-call who reviewed the imaging and felt that MRI could be performed at his outpatient follow-up since we do not have MRI here at our facility.  Of note, the MRIs that were performed after his stroke did not show any evidence of a brain mass.  Upon re-evaluation the patient remained stable.  Was instructed to follow-up with neurology and discuss outpatient MRI with them.  This patient presents to the ED for concern of complaints listed in HPI, this involves an extensive number of treatment options, and is a complaint that carries with it a high risk of complications and morbidity. Disposition including potential need for admission considered.   Dispo: DC Home. Return precautions discussed including, but not limited to, those listed in the AVS. Allowed pt time to ask questions which were answered fully prior to dc.  Additional history obtained from spouse Records reviewed DC Summary I independently reviewed the following imaging with scope of interpretation limited to determining acute life threatening conditions related to emergency care: CT Head and agree with the radiologist interpretation with the following exceptions: none I personally reviewed and interpreted cardiac monitoring: normal sinus rhythm  I personally reviewed and interpreted the pt's EKG: see above for interpretation  I have reviewed the patients home medications and made adjustments as needed Consults: Neurosurgery  Final Clinical Impression(s) / ED Diagnoses Final diagnoses:  Right-sided nontraumatic intracerebral hemorrhage, unspecified cerebral location [facility])  Cerebral edema [facility])    Rx / DC Orders ED Discharge Orders     None          Rondel Baton, MD 12/30/22 1910

## 2023-02-11 ENCOUNTER — Inpatient Hospital Stay: Payer: Self-pay | Admitting: Neurology

## 2023-04-10 IMAGING — CT CT WRIST*R* W/O CM
1 series · 12 of 14 positions shown, 15 images · non-contrast
Comparison: Radiographs 10/11/2021.

CLINICAL DATA: Fall from truck. Distal radial fracture. Surgical
planning.

EXAM:
CT OF THE RIGHT WRIST WITHOUT CONTRAST
TECHNIQUE: Multidetector CT imaging of the right wrist was performed according
to the standard protocol. Multiplanar CT image reconstructions were
also generated.
RADIATION DOSE REDUCTION: This exam was performed according to the
departmental dose-optimization program which includes automated
exposure control, adjustment of the mA and/or kV according to
patient size and/or use of iterative reconstruction technique.

[Series 2: ext soft · axial · 0.56mm/px · z∈[-164,+12]mm · 12 of 106 slices shown, 15 images]
[im 9/106  soft-tissue]
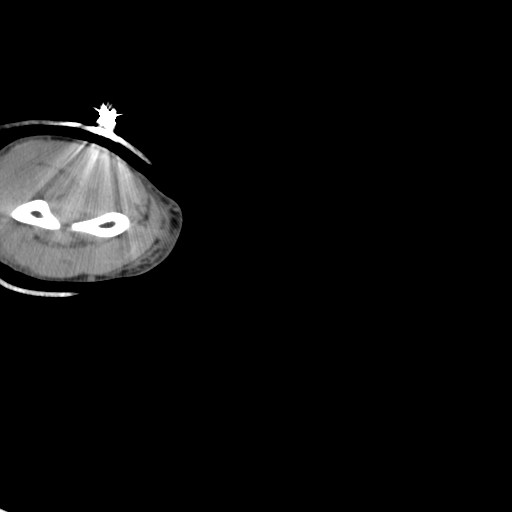
[im 9/106  bone]
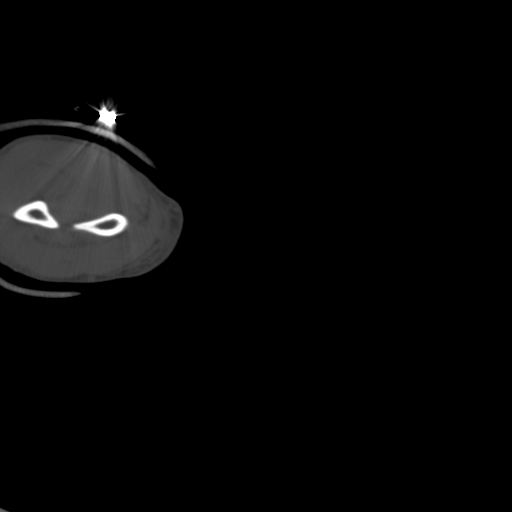
[im 17/106  bone]
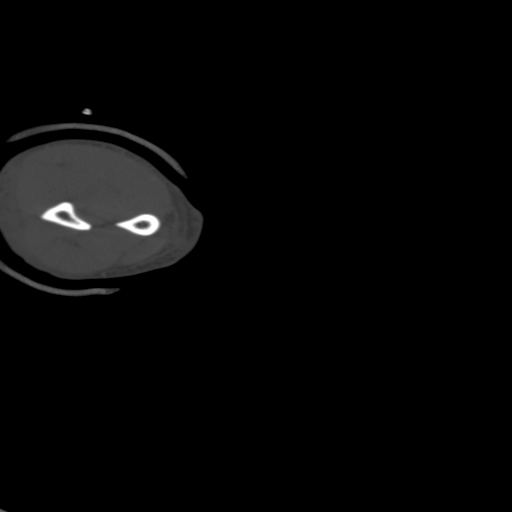
[im 25/106  bone]
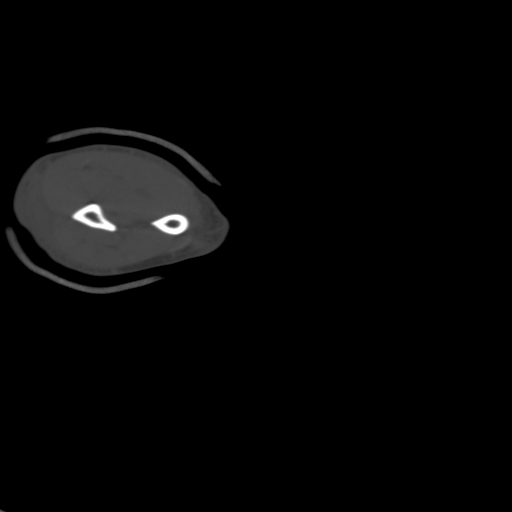
[im 33/106  bone]
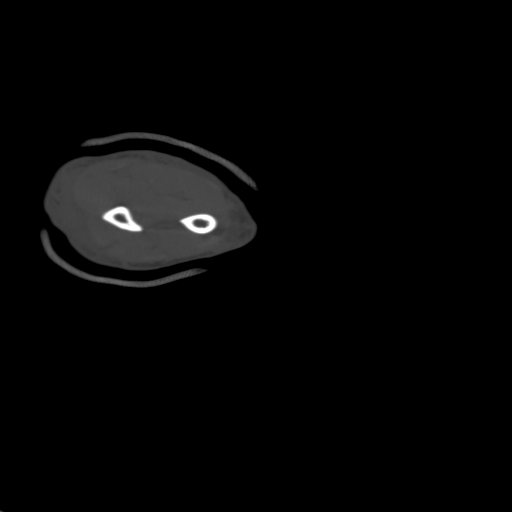
[im 41/106  soft-tissue]
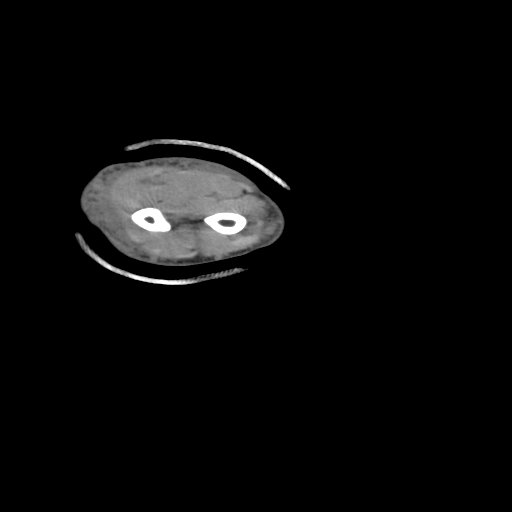
[im 41/106  bone]
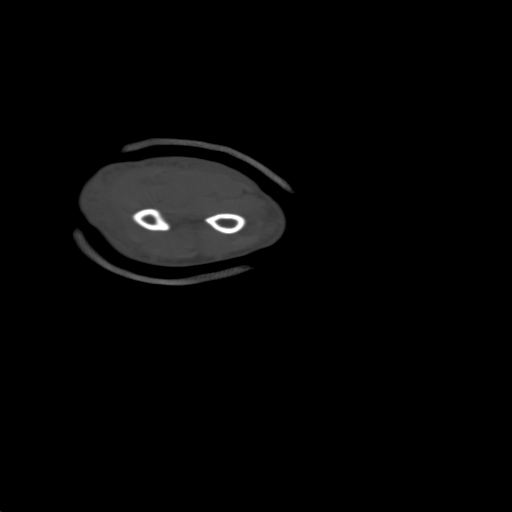
[im 49/106  bone]
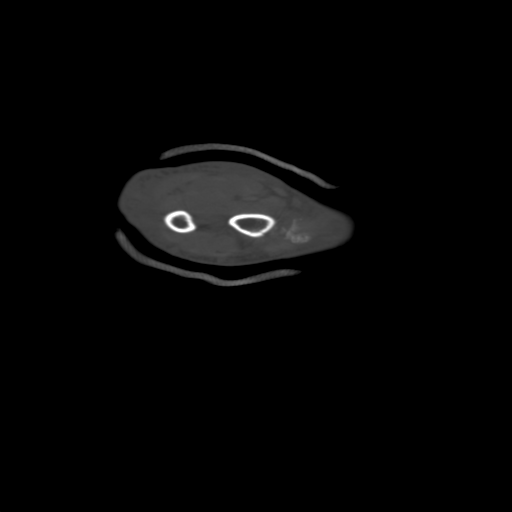
[im 57/106  bone]
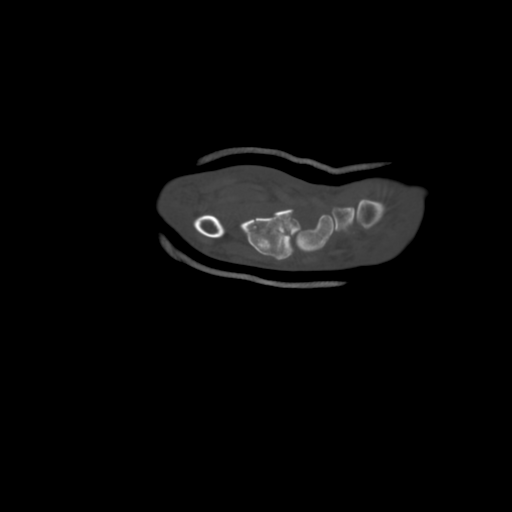
[im 65/106  bone]
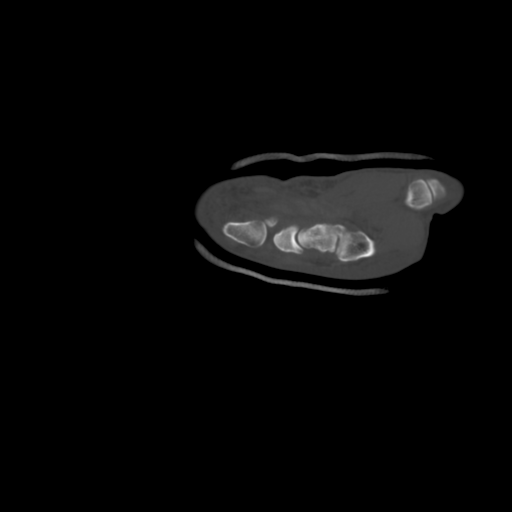
[im 73/106  soft-tissue]
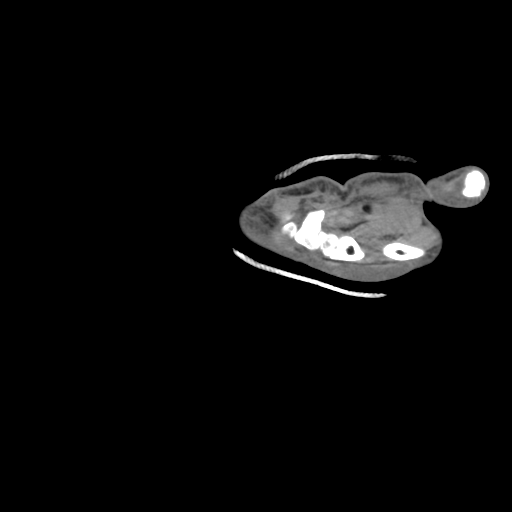
[im 73/106  bone]
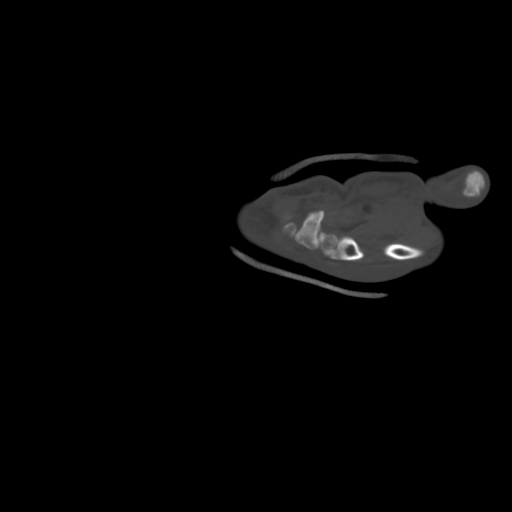
[im 81/106  bone]
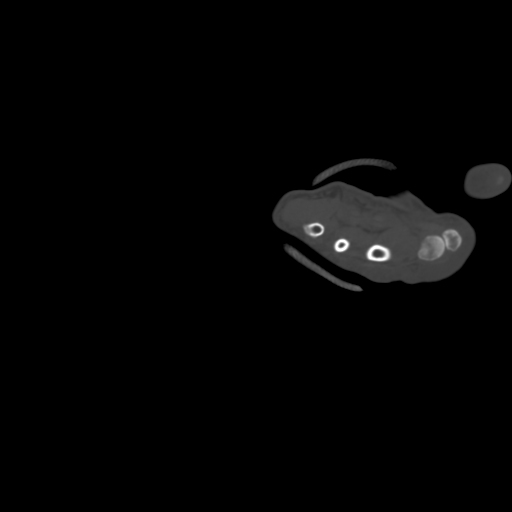
[im 89/106  bone]
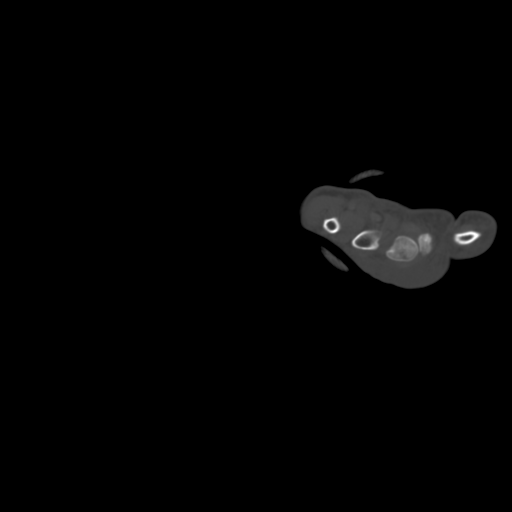
[im 97/106  bone]
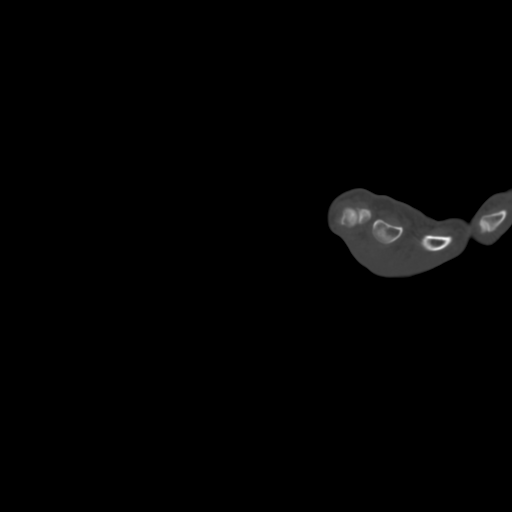

[12 of 14 positions shown; findings below may reference images not displayed]

FINDINGS: Bones/Joint/Cartilage

There is an impacted, comminuted and moderately displaced
intra-articular fracture of the distal radius. This fracture
demonstrates up to 2 mm of depression of the distal articular
surface anteriorly. There is predominately dorsal displacement at
the fracture by up to 0.6 cm. There is a large metaphyseal butterfly
fragment which is displaced up to 1.3 cm medially. Nondisplaced
fracture extends into the distal radioulnar joint. There is a small
laterally displaced ulnar styloid fracture.

The carpal bones are normally aligned and appear intact, without
evidence of acute fracture.

Ligaments

Suboptimally assessed by CT.

Muscles and Tendons

As evaluated by CT, the wrist tendons appear intact without
entrapment.

Soft tissues

Diffuse soft tissue swelling around the wrist, extending into the
dorsum of the hand. No evidence of foreign body or soft tissue
emphysema.
IMPRESSION: 1. Impacted, displaced and comminuted intra-articular fracture of
the distal radius as described.
2. Laterally displaced ulnar styloid fracture.
3. No evidence of carpal bone fracture or dislocation.
